# Patient Record
Sex: Male | Born: 1945
Health system: Southern US, Community
[De-identification: ages and names within clinical notes are randomized; demographics above are authoritative.]

## PROBLEM LIST (undated history)

## (undated) DIAGNOSIS — G20A1 Parkinson's disease without dyskinesia, without mention of fluctuations: Secondary | ICD-10-CM

## (undated) DIAGNOSIS — G250 Essential tremor: Secondary | ICD-10-CM

## (undated) DIAGNOSIS — I10 Essential (primary) hypertension: Secondary | ICD-10-CM

## (undated) DIAGNOSIS — G20 Parkinson's disease: Secondary | ICD-10-CM

## (undated) DIAGNOSIS — G252 Other specified forms of tremor: Secondary | ICD-10-CM

## (undated) DIAGNOSIS — F419 Anxiety disorder, unspecified: Secondary | ICD-10-CM

## (undated) DIAGNOSIS — R4789 Other speech disturbances: Secondary | ICD-10-CM

## (undated) DIAGNOSIS — E119 Type 2 diabetes mellitus without complications: Secondary | ICD-10-CM

## (undated) DIAGNOSIS — R269 Unspecified abnormalities of gait and mobility: Secondary | ICD-10-CM

## (undated) HISTORY — DX: Type 2 diabetes mellitus without complications: E11.9

## (undated) HISTORY — DX: Essential (primary) hypertension: I10

## (undated) HISTORY — DX: Anxiety disorder, unspecified: F41.9

## (undated) HISTORY — PX: OTHER SURGICAL HISTORY: SHX169

## (undated) HISTORY — DX: Unspecified abnormalities of gait and mobility: R26.9

## (undated) HISTORY — PX: NECK SURGERY: SHX720

## (undated) HISTORY — DX: Other speech disturbances: R47.89

## (undated) HISTORY — DX: Essential tremor: G25.0

## (undated) HISTORY — PX: ROTATOR CUFF REPAIR: SHX139

## (undated) HISTORY — DX: Parkinson's disease: G20

## (undated) HISTORY — PX: HERNIA REPAIR: SHX51

## (undated) HISTORY — DX: Essential tremor: G25.2

## (undated) HISTORY — DX: Parkinson's disease without dyskinesia, without mention of fluctuations: G20.A1

---

## 2001-05-15 ENCOUNTER — Ambulatory Visit (HOSPITAL_COMMUNITY): Admission: RE | Admit: 2001-05-15 | Discharge: 2001-05-15 | Payer: Self-pay | Admitting: Gastroenterology

## 2001-05-15 ENCOUNTER — Encounter (INDEPENDENT_AMBULATORY_CARE_PROVIDER_SITE_OTHER): Payer: Self-pay | Admitting: *Deleted

## 2001-05-21 ENCOUNTER — Encounter: Admission: RE | Admit: 2001-05-21 | Discharge: 2001-05-21 | Payer: Self-pay | Admitting: Gastroenterology

## 2001-05-21 ENCOUNTER — Encounter: Payer: Self-pay | Admitting: Gastroenterology

## 2001-07-10 ENCOUNTER — Encounter (INDEPENDENT_AMBULATORY_CARE_PROVIDER_SITE_OTHER): Payer: Self-pay | Admitting: *Deleted

## 2001-07-10 ENCOUNTER — Ambulatory Visit (HOSPITAL_COMMUNITY): Admission: RE | Admit: 2001-07-10 | Discharge: 2001-07-10 | Payer: Self-pay | Admitting: Gastroenterology

## 2002-02-11 ENCOUNTER — Encounter: Payer: Self-pay | Admitting: Internal Medicine

## 2002-02-11 ENCOUNTER — Encounter: Admission: RE | Admit: 2002-02-11 | Discharge: 2002-02-11 | Payer: Self-pay | Admitting: Internal Medicine

## 2002-04-21 ENCOUNTER — Encounter: Payer: Self-pay | Admitting: Neurosurgery

## 2002-04-23 ENCOUNTER — Encounter: Payer: Self-pay | Admitting: Neurosurgery

## 2002-04-23 ENCOUNTER — Observation Stay (HOSPITAL_COMMUNITY): Admission: RE | Admit: 2002-04-23 | Discharge: 2002-04-24 | Payer: Self-pay | Admitting: Neurosurgery

## 2002-04-24 ENCOUNTER — Encounter: Payer: Self-pay | Admitting: Neurosurgery

## 2002-05-15 ENCOUNTER — Encounter: Payer: Self-pay | Admitting: Orthopedic Surgery

## 2002-05-15 ENCOUNTER — Ambulatory Visit (HOSPITAL_COMMUNITY): Admission: RE | Admit: 2002-05-15 | Discharge: 2002-05-15 | Payer: Self-pay | Admitting: Orthopedic Surgery

## 2002-06-01 ENCOUNTER — Ambulatory Visit (HOSPITAL_BASED_OUTPATIENT_CLINIC_OR_DEPARTMENT_OTHER): Admission: RE | Admit: 2002-06-01 | Discharge: 2002-06-01 | Payer: Self-pay | Admitting: Internal Medicine

## 2002-06-18 ENCOUNTER — Ambulatory Visit (HOSPITAL_BASED_OUTPATIENT_CLINIC_OR_DEPARTMENT_OTHER): Admission: RE | Admit: 2002-06-18 | Discharge: 2002-06-18 | Payer: Self-pay | Admitting: Orthopedic Surgery

## 2002-08-15 ENCOUNTER — Encounter: Payer: Self-pay | Admitting: Orthopedic Surgery

## 2002-08-15 ENCOUNTER — Ambulatory Visit (HOSPITAL_COMMUNITY): Admission: RE | Admit: 2002-08-15 | Discharge: 2002-08-15 | Payer: Self-pay | Admitting: Orthopedic Surgery

## 2002-10-30 ENCOUNTER — Encounter: Payer: Self-pay | Admitting: Gastroenterology

## 2002-10-30 ENCOUNTER — Encounter: Admission: RE | Admit: 2002-10-30 | Discharge: 2002-10-30 | Payer: Self-pay | Admitting: Gastroenterology

## 2003-01-30 ENCOUNTER — Inpatient Hospital Stay (HOSPITAL_COMMUNITY): Admission: RE | Admit: 2003-01-30 | Discharge: 2003-02-02 | Payer: Self-pay | Admitting: Neurosurgery

## 2003-01-30 ENCOUNTER — Encounter: Payer: Self-pay | Admitting: Neurosurgery

## 2005-04-03 ENCOUNTER — Encounter: Admission: RE | Admit: 2005-04-03 | Discharge: 2005-04-03 | Payer: Self-pay | Admitting: Neurosurgery

## 2005-04-24 ENCOUNTER — Encounter: Admission: RE | Admit: 2005-04-24 | Discharge: 2005-04-24 | Payer: Self-pay | Admitting: Neurosurgery

## 2005-10-07 ENCOUNTER — Ambulatory Visit (HOSPITAL_COMMUNITY): Admission: RE | Admit: 2005-10-07 | Discharge: 2005-10-07 | Payer: Self-pay | Admitting: Neurosurgery

## 2006-06-20 ENCOUNTER — Observation Stay (HOSPITAL_COMMUNITY): Admission: RE | Admit: 2006-06-20 | Discharge: 2006-06-21 | Payer: Self-pay | Admitting: Neurosurgery

## 2006-11-13 ENCOUNTER — Ambulatory Visit: Payer: Self-pay | Admitting: Internal Medicine

## 2007-02-26 ENCOUNTER — Inpatient Hospital Stay (HOSPITAL_COMMUNITY): Admission: RE | Admit: 2007-02-26 | Discharge: 2007-03-01 | Payer: Self-pay | Admitting: Neurosurgery

## 2007-09-02 ENCOUNTER — Ambulatory Visit (HOSPITAL_COMMUNITY): Admission: RE | Admit: 2007-09-02 | Discharge: 2007-09-02 | Payer: Self-pay | Admitting: Gastroenterology

## 2008-08-16 ENCOUNTER — Encounter: Admission: RE | Admit: 2008-08-16 | Discharge: 2008-08-16 | Payer: Self-pay | Admitting: Neurosurgery

## 2008-08-24 ENCOUNTER — Ambulatory Visit (HOSPITAL_BASED_OUTPATIENT_CLINIC_OR_DEPARTMENT_OTHER): Admission: RE | Admit: 2008-08-24 | Discharge: 2008-08-24 | Payer: Self-pay | Admitting: Orthopedic Surgery

## 2010-10-16 ENCOUNTER — Encounter: Payer: Self-pay | Admitting: Neurosurgery

## 2010-10-16 ENCOUNTER — Encounter: Payer: Self-pay | Admitting: Neurology

## 2011-02-07 NOTE — H&P (Signed)
NAME:  Steven Pitts, Steven Pitts NO.:  [id number]   MEDICAL RECORD NO.:  [id number]          PATIENT TYPE:  INP   LOCATION:  3172                         FACILITY:  [facility]   PHYSICIAN:  Elizabeth Sauer, M.D.      DATE OF BIRTH:  12/24/1945   DATE OF ADMISSION:  02/26/2007  DATE OF DISCHARGE:                              HISTORY & PHYSICAL   ADMITTING DIAGNOSIS:  Spondylosis C3-C4.   ________   This is a very nice 65 year old, right-handed, white gentleman who  underwent an anterior decompression and fusion at C4-C5.  Did well with  that.  Developed spondylitic change at C3-C4 and C5-C6, and underwent a  second fusion at C5-C6.  Did well with that.  C3-C4 could not be  accessed anteriorly because of anatomic considerations.  He is now  admitted for posterior cervical fusion at C3-C4.   PAST MEDICAL HISTORY:  Fairly benign.  He has a history of hypertension  and GI difficulties.   PAST SURGICAL HISTORY:  Herniorrhaphy, tonsillectomy in the past.   ALLERGIES:  HE HAS NO ALLERGIES.   FAMILY HISTORY:  Parents are both deceased.  History is not given.   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   REVIEW OF SYSTEMS:  Remarkable for eye glasses, hypertension, arm  weakness, arm pain, neck pain, difficulty with speech, and depression.   MEDICATIONS:  He takes Inderal and Prozac, Nexium and  hydrochlorothiazide.   PHYSICAL EXAMINATION:  HEENT:  Within normal limits.  NECK:  He has slightly limited range of motion in his neck.  CHEST:  Clear.  CARDIAC:  Regular rate and rhythm.  ABDOMEN:  Nontender.  No hepatosplenomegaly.  EXTREMITIES:  Without clubbing or cyanosis.  GU:  Deferred.  Peripheral pulses are good.  NEUROLOGICALLY:  He is awake, alert and oriented.  His cranial nerves  are intact.  Motor exam shows 5/5 strength throughout the upper and  lower extremities.  There is a lot of pain with neck movement.  Hoffman's is negative.   Reflexes are normal.   RADIOLOGICAL STUDIES:  MRI and plain films demonstrate cervical  spondylosis with degenerative changes at C3-C4.   PLAN:  Posterior cervical fusion at C3-C4.  The risks and benefits have  been discussed with him, and he wishes to proceed.           ______________________________  Elizabeth Sauer, M.D.     MWR/MEDQ  D:  02/26/2007  T:  02/26/2007  Job:  [id number]

## 2011-02-07 NOTE — Op Note (Signed)
NAME:  Steven Pitts, Steven Pitts NO.:  [id number]   MEDICAL RECORD NO.:  [id number]          PATIENT TYPE:  INP   LOCATION:  2899                         FACILITY:  [facility]   PHYSICIAN:  Elizabeth Sauer, M.D.      DATE OF BIRTH:  [birth year]   DATE OF PROCEDURE:  02/26/2007  DATE OF DISCHARGE:                               OPERATIVE REPORT   PREOPERATIVE DIAGNOSIS:  Spondylosis C3-4.   POSTOPERATIVE DIAGNOSIS:  Spondylosis C3-4.   PROCEDURE:  C3-4 posterior cervical fusion.   SURGEON:  Elizabeth Sauer, M.D.   ANESTHESIA:  General endotracheal.   PREP:  Sterile Betadine prep and scrub with alcohol wipe.   COMPLICATIONS:  None.   NURSE ASSISTANT:  [place].   DOCTOR ASSISTANT:  Luiz Ochoa.   BODY OF TEXT:  This is 65 year old gentleman with cervical spondylosis,  could not be approached anteriorly, taken to operating room, smoothly  anesthetized intubated, placed prone on the Stryker bed.  Following  shave, prep, drape in usual sterile fashion.  Skin was infiltrated with  1% lidocaine with 1:400,000 epinephrine.  The skin was incised from C2  to C5 and the lamina of C3 and C4 expose subperiosteal plane.  Intraoperative x-ray confirmed correctness of level, having confirmed  correctness level, lateral mass screws were placed using the standard  landmarks.  They were connected with the rod and locking caps tightened.  Intraoperative x-ray showed good placement of screws.  The lamina and  facet joints decorticated the high-speed drill packed with BMP on the  Actifuse extender matrix.  Successive layers of 0-0 Vicryl, 2-0 Vicryl,  3-0 nylon were used to close.  Betadine Telfa dressing was applied.  The  patient returned recovery room in good condition.           ______________________________  Elizabeth Sauer, M.D.     MWR/MEDQ  D:  02/26/2007  T:  02/26/2007  Job:  [id number]

## 2011-02-07 NOTE — Op Note (Signed)
NAME:  Steven Pitts, Steven Pitts NO.:  [id number]   MEDICAL RECORD NO.:  [id number]          PATIENT TYPE:  AMB   LOCATION:  [place]                          FACILITY:  [facility]   PHYSICIAN:  Estill Bamberg. Ronnie Derby, M.D. DATE OF BIRTH:  July 15, 1946   DATE OF PROCEDURE:  08/24/2008  DATE OF DISCHARGE:                               OPERATIVE REPORT   SURGEON:  Estill Bamberg. Ronnie Derby, MD   ASSISTANT:  Carlynn Spry, PA-C   ANESTHESIA:  General.   PREOPERATIVE DIAGNOSES:  Left shoulder partial rotator cuff tear,  tendinosis, and a possible labral tear.   POSTOPERATIVE DIAGNOSES:  Labral tear, partial-thickness rotator cuff  tear, and osteoarthritis of the acromioclavicular joint.   INDICATIONS FOR PROCEDURE:  The patient is a 65 year old with mechanical  symptoms.  MRI evidence of tendinosis, AC joint arthritis.  Informed  consent obtained.   DESCRIPTION OF PROCEDURE:  The patient was laid supine, administered  general anesthesia, and then placed in the beach chair position.  The  left shoulder was prepped and draped in usual sterile fashion.  Anteroposterior direct lateral portals were created with a #11 blade,  blunt trocar, and cannula.  Diagnostic arthroscopy revealed no  chondromalacia in the glenohumeral joint but some degenerative labral  tearing, some was probably acute anteriorly, and this was debrided  through the anterior portal.  I then went to the subacromial space from  the posterior portal and performed a bursectomy with the Cuda shaver  through the lateral portal.  I then used the ArthroCare debridement wand  to release the CA ligament, clean off the undersurface of the acromion  and distal clavicle.  I used a 4.0-mm cylindrical bur to perform  decompression all the way to through and removing the distal clavicle.  This afforded excellent decompression.  I then used the shaver and  shaved the rotator cuff and did not find any full-thickness tearing.  I  think there was  partial-thickness tearing at the rotator cuff interval  that I did see on the undersurface, but it was obviously not full  thickness.  I then lavaged and closed with 4-0 nylon sutures, dressed  with Xeroform dressing, sponges, and __________ shoulder dressing.   COMPLICATIONS:  None.   DRAINS:  None.           ______________________________  Estill Bamberg. Ronnie Derby, M.D.     SDL/MEDQ  D:  08/24/2008  T:  08/24/2008  Job:  [id number]

## 2011-02-07 NOTE — Discharge Summary (Signed)
NAME:  Steven Pitts, Steven Pitts NO.:  [id number]   MEDICAL RECORD NO.:  [id number]          PATIENT TYPE:  INP   LOCATION:  5152                         FACILITY:  [facility]   PHYSICIAN:  Elizabeth Sauer, M.D.      DATE OF BIRTH:  [birth year]   DATE OF ADMISSION:  02/26/2007  DATE OF DISCHARGE:  03/01/2007                               DISCHARGE SUMMARY   ADMITTING DIAGNOSIS:  Spondylosis, C3-4.   POSTOPERATIVE DIAGNOSIS:  Spondylosis, C3-4.   PROCEDURES:  C3-4 posterior cervical fusion.   DICTATING DOCTOR:  Dr. Glenna Fellows.   This is a 65 year old gentleman.  His history and physical is recounted  on the chart.  He has had the interior fusion.  Could not approach C3-4  anteriorly, so he was deferred to posterior fusion.  He is admitted for  a posterior cervical fusion.  He was admitted after ascertainment of  laboratory values and on the day of admission underwent a posterior  cervical fusion at C3-4.  Procedure was uncomplicated and  postoperatively he has done well.  Required PCA for the first 2 days,  and the PCA was stopped.  Now, he is up and about eating and voiding  normally.  Using Percocet for pain.  He is neurologically intact.   PLAN:  He is being discharged home in care of his family.  His followup  will be in the [facility] offices in about a week for sutures.           ______________________________  Elizabeth Sauer, M.D.     MWR/MEDQ  D:  03/01/2007  T:  03/01/2007  Job:  [id number]

## 2011-02-10 NOTE — Procedures (Signed)
[place]  Patient:    Steven Pitts, Steven Pitts Visit Number: [id number] MRN: [id number]          Service Type: END Location: ENDO Attending Physician:  Juanita Craver Dictated by:   Nelwyn Salisbury, M.D. Proc. Date: 07/10/01 Admit Date:  07/10/2001   CC:         Orpah Melter, M.D.                           Procedure Report  DATE OF BIRTH:  01-05-1946.  PROCEDURE:  Colonoscopy.  ENDOSCOPIST:  Nelwyn Salisbury, M.D.  INSTRUMENT USED:  Olympus video colonoscope.  INDICATION FOR PROCEDURE:  Guaiac-positive stool in a 65 year old white male. Rule out colonic polyps, masses, hemorrhoids, etc.  PREPROCEDURE PREPARATION:  The patient was fasted for eight hours prior to the procedure and prepped with a bottle of magnesium citrate and a gallon of NuLytely the night prior to the procedure.  PREPROCEDURE PHYSICAL:  VITAL SIGNS:  The patient had stable vital signs.  NECK:  Supple.  CHEST:  Clear to auscultation.  S1, S2 regular.  ABDOMEN:  Soft with normal bowel sounds.  DESCRIPTION OF PROCEDURE:  The patient was placed in the left lateral decubitus position and sedated with an additional 2 mg of Versed.  Once the patient was adequately sedate and maintained on low-flow oxygen and continuous cardiac monitoring, the Olympus video colonoscope was advanced from the rectum to the cecum with slight difficulty secondary to some residual stool in the colon.  No masses, polyps, erosions, ulcerations, or diverticula were seen.  IMPRESSION:  Normal-appearing colon.  RECOMMENDATIONS:  Repeat guaiac testing will be done as an outpatient and further recommendations made as needed. Dictated by:   Nelwyn Salisbury, M.D. Attending Physician:  Juanita Craver DD:  07/10/01 TD:  07/11/01 Job: [id number]

## 2011-06-27 LAB — GLUCOSE, CAPILLARY
Glucose-Capillary: 116 — ABNORMAL HIGH
Glucose-Capillary: 117 — ABNORMAL HIGH

## 2011-06-27 LAB — BASIC METABOLIC PANEL
GFR calc Af Amer: >60
GFR calc non Af Amer: >60
Glucose, Bld: 120 — ABNORMAL HIGH
Potassium: 3.6
Sodium: 143

## 2011-06-27 LAB — POCT HEMOGLOBIN-HEMACUE: Hemoglobin: 13.8

## 2012-07-26 ENCOUNTER — Ambulatory Visit: Payer: BC Managed Care – PPO | Attending: Neurology | Admitting: Physical Therapy

## 2012-07-26 DIAGNOSIS — Z5189 Encounter for other specified aftercare: Secondary | ICD-10-CM | POA: Insufficient documentation

## 2012-07-26 DIAGNOSIS — M6281 Muscle weakness (generalized): Secondary | ICD-10-CM | POA: Insufficient documentation

## 2012-07-26 DIAGNOSIS — G20 Parkinson's disease: Secondary | ICD-10-CM | POA: Insufficient documentation

## 2012-07-26 DIAGNOSIS — R279 Unspecified lack of coordination: Secondary | ICD-10-CM | POA: Insufficient documentation

## 2012-07-26 DIAGNOSIS — G20A1 Parkinson's disease without dyskinesia, without mention of fluctuations: Secondary | ICD-10-CM | POA: Insufficient documentation

## 2012-07-26 DIAGNOSIS — M2420 Disorder of ligament, unspecified site: Secondary | ICD-10-CM | POA: Insufficient documentation

## 2012-07-26 DIAGNOSIS — M629 Disorder of muscle, unspecified: Secondary | ICD-10-CM | POA: Insufficient documentation

## 2012-08-06 ENCOUNTER — Ambulatory Visit: Payer: BC Managed Care – PPO | Admitting: Occupational Therapy

## 2012-08-07 ENCOUNTER — Ambulatory Visit: Payer: BC Managed Care – PPO | Admitting: Physical Therapy

## 2012-08-07 ENCOUNTER — Ambulatory Visit: Payer: BC Managed Care – PPO | Admitting: Occupational Therapy

## 2012-08-14 ENCOUNTER — Ambulatory Visit: Payer: BC Managed Care – PPO | Admitting: Occupational Therapy

## 2012-08-14 ENCOUNTER — Ambulatory Visit: Payer: BC Managed Care – PPO | Admitting: Physical Therapy

## 2012-08-19 ENCOUNTER — Ambulatory Visit: Payer: BC Managed Care – PPO | Admitting: Physical Therapy

## 2012-08-19 ENCOUNTER — Ambulatory Visit: Payer: BC Managed Care – PPO | Admitting: Occupational Therapy

## 2012-08-27 ENCOUNTER — Ambulatory Visit: Payer: BC Managed Care – PPO | Admitting: Physical Therapy

## 2012-08-27 ENCOUNTER — Encounter: Payer: BC Managed Care – PPO | Admitting: Occupational Therapy

## 2012-08-29 ENCOUNTER — Ambulatory Visit: Payer: BC Managed Care – PPO | Admitting: Occupational Therapy

## 2012-08-29 ENCOUNTER — Ambulatory Visit: Payer: BC Managed Care – PPO | Attending: Neurology | Admitting: Physical Therapy

## 2012-08-29 DIAGNOSIS — M6281 Muscle weakness (generalized): Secondary | ICD-10-CM | POA: Insufficient documentation

## 2012-08-29 DIAGNOSIS — G20 Parkinson's disease: Secondary | ICD-10-CM | POA: Insufficient documentation

## 2012-08-29 DIAGNOSIS — M2420 Disorder of ligament, unspecified site: Secondary | ICD-10-CM | POA: Insufficient documentation

## 2012-08-29 DIAGNOSIS — G20A1 Parkinson's disease without dyskinesia, without mention of fluctuations: Secondary | ICD-10-CM | POA: Insufficient documentation

## 2012-08-29 DIAGNOSIS — M629 Disorder of muscle, unspecified: Secondary | ICD-10-CM | POA: Insufficient documentation

## 2012-08-29 DIAGNOSIS — R279 Unspecified lack of coordination: Secondary | ICD-10-CM | POA: Insufficient documentation

## 2012-08-29 DIAGNOSIS — Z5189 Encounter for other specified aftercare: Secondary | ICD-10-CM | POA: Insufficient documentation

## 2012-09-03 ENCOUNTER — Ambulatory Visit: Payer: BC Managed Care – PPO | Admitting: Physical Therapy

## 2012-09-03 ENCOUNTER — Ambulatory Visit: Payer: BC Managed Care – PPO | Admitting: Occupational Therapy

## 2012-09-05 ENCOUNTER — Encounter: Payer: BC Managed Care – PPO | Admitting: Occupational Therapy

## 2012-09-05 ENCOUNTER — Ambulatory Visit: Payer: BC Managed Care – PPO | Admitting: Physical Therapy

## 2012-09-09 ENCOUNTER — Ambulatory Visit: Payer: BC Managed Care – PPO | Admitting: Physical Therapy

## 2012-09-11 ENCOUNTER — Ambulatory Visit: Payer: BC Managed Care – PPO | Admitting: Physical Therapy

## 2012-09-30 ENCOUNTER — Encounter: Payer: BC Managed Care – PPO | Admitting: Occupational Therapy

## 2012-09-30 ENCOUNTER — Ambulatory Visit: Payer: BC Managed Care – PPO | Admitting: Physical Therapy

## 2012-09-30 ENCOUNTER — Ambulatory Visit: Payer: BC Managed Care – PPO | Attending: Neurology | Admitting: Occupational Therapy

## 2012-09-30 DIAGNOSIS — G20A1 Parkinson's disease without dyskinesia, without mention of fluctuations: Secondary | ICD-10-CM | POA: Insufficient documentation

## 2012-09-30 DIAGNOSIS — G20 Parkinson's disease: Secondary | ICD-10-CM | POA: Insufficient documentation

## 2012-09-30 DIAGNOSIS — M629 Disorder of muscle, unspecified: Secondary | ICD-10-CM | POA: Insufficient documentation

## 2012-09-30 DIAGNOSIS — M6281 Muscle weakness (generalized): Secondary | ICD-10-CM | POA: Insufficient documentation

## 2012-09-30 DIAGNOSIS — M2420 Disorder of ligament, unspecified site: Secondary | ICD-10-CM | POA: Insufficient documentation

## 2012-09-30 DIAGNOSIS — R279 Unspecified lack of coordination: Secondary | ICD-10-CM | POA: Insufficient documentation

## 2012-09-30 DIAGNOSIS — Z5189 Encounter for other specified aftercare: Secondary | ICD-10-CM | POA: Insufficient documentation

## 2012-10-02 ENCOUNTER — Encounter: Payer: BC Managed Care – PPO | Admitting: Occupational Therapy

## 2012-10-02 ENCOUNTER — Ambulatory Visit: Payer: BC Managed Care – PPO | Admitting: Physical Therapy

## 2012-10-02 ENCOUNTER — Ambulatory Visit: Payer: BC Managed Care – PPO | Admitting: Occupational Therapy

## 2012-10-07 ENCOUNTER — Ambulatory Visit: Payer: BC Managed Care – PPO | Admitting: Physical Therapy

## 2012-10-07 ENCOUNTER — Encounter: Payer: BC Managed Care – PPO | Admitting: Occupational Therapy

## 2012-10-07 ENCOUNTER — Ambulatory Visit: Payer: BC Managed Care – PPO | Admitting: Occupational Therapy

## 2012-10-09 ENCOUNTER — Ambulatory Visit: Payer: BC Managed Care – PPO | Admitting: Physical Therapy

## 2012-10-09 ENCOUNTER — Ambulatory Visit: Payer: BC Managed Care – PPO | Admitting: Occupational Therapy

## 2012-10-09 ENCOUNTER — Encounter: Payer: BC Managed Care – PPO | Admitting: Occupational Therapy

## 2012-10-14 ENCOUNTER — Ambulatory Visit: Payer: BC Managed Care – PPO | Admitting: Physical Therapy

## 2012-10-14 ENCOUNTER — Ambulatory Visit: Payer: BC Managed Care – PPO | Admitting: Occupational Therapy

## 2012-10-16 ENCOUNTER — Ambulatory Visit: Payer: BC Managed Care – PPO | Admitting: Occupational Therapy

## 2012-10-16 ENCOUNTER — Ambulatory Visit: Payer: BC Managed Care – PPO | Admitting: Physical Therapy

## 2012-10-22 ENCOUNTER — Ambulatory Visit: Payer: BC Managed Care – PPO | Admitting: Physical Therapy

## 2012-10-22 ENCOUNTER — Encounter: Payer: BC Managed Care – PPO | Admitting: Occupational Therapy

## 2012-10-24 ENCOUNTER — Ambulatory Visit: Payer: BC Managed Care – PPO | Admitting: Physical Therapy

## 2012-10-24 ENCOUNTER — Ambulatory Visit: Payer: BC Managed Care – PPO | Admitting: Occupational Therapy

## 2012-12-17 ENCOUNTER — Other Ambulatory Visit: Payer: Self-pay | Admitting: Orthopedic Surgery

## 2012-12-17 DIAGNOSIS — M25512 Pain in left shoulder: Secondary | ICD-10-CM

## 2012-12-24 ENCOUNTER — Ambulatory Visit
Admission: RE | Admit: 2012-12-24 | Discharge: 2012-12-24 | Disposition: A | Discharge status: Home / Self Care | Payer: BC Managed Care – PPO | Source: Ambulatory Visit | Attending: Orthopedic Surgery | Admitting: Orthopedic Surgery

## 2012-12-24 DIAGNOSIS — M25512 Pain in left shoulder: Secondary | ICD-10-CM

## 2013-04-04 ENCOUNTER — Encounter: Payer: Self-pay | Admitting: Neurology

## 2013-04-04 ENCOUNTER — Ambulatory Visit (INDEPENDENT_AMBULATORY_CARE_PROVIDER_SITE_OTHER): Payer: BC Managed Care – PPO | Admitting: Neurology

## 2013-04-04 VITALS — BP 131/78 | HR 72 | Ht 68.0 in | Wt 204.0 lb

## 2013-04-04 DIAGNOSIS — G20 Parkinson's disease: Secondary | ICD-10-CM

## 2013-04-04 DIAGNOSIS — G250 Essential tremor: Secondary | ICD-10-CM

## 2013-04-04 DIAGNOSIS — G252 Other specified forms of tremor: Secondary | ICD-10-CM

## 2013-04-04 DIAGNOSIS — I10 Essential (primary) hypertension: Secondary | ICD-10-CM

## 2013-04-04 DIAGNOSIS — F411 Generalized anxiety disorder: Secondary | ICD-10-CM

## 2013-04-04 DIAGNOSIS — F419 Anxiety disorder, unspecified: Secondary | ICD-10-CM

## 2013-04-04 DIAGNOSIS — R4789 Other speech disturbances: Secondary | ICD-10-CM | POA: Insufficient documentation

## 2013-04-04 DIAGNOSIS — R269 Unspecified abnormalities of gait and mobility: Secondary | ICD-10-CM | POA: Insufficient documentation

## 2013-04-04 DIAGNOSIS — E119 Type 2 diabetes mellitus without complications: Secondary | ICD-10-CM | POA: Insufficient documentation

## 2013-04-04 DIAGNOSIS — R251 Tremor, unspecified: Secondary | ICD-10-CM | POA: Insufficient documentation

## 2013-04-04 MED ORDER — CARBIDOPA-LEVODOPA 25-100 MG PO TABS: 25.0000 | ORAL_TABLET | Freq: Every day | ORAL | Status: DC

## 2013-04-04 MED ORDER — ROPINIROLE HCL ER 12 MG PO TB24: 12.0000 mg | ORAL_TABLET | Freq: Every day | ORAL | Status: DC

## 2013-04-04 NOTE — Progress Notes (Signed)
History of Present Illness:   Mr. Steven Pitts is a 67 year old right-handed Caucasian male, alone at today's clinical visit.  He has past medical history of essential hypertension, type 2 diabetes,obstructive sleep apnea, using CPAP machine, depression anxiety, he presented with  5 years history of gradual onset, slow worsening gait difficulty.  At age 41, he began to experience bilateral hand tremor, voice tremor, especially when he is anxious,he was diagnosed with essential tremor, has been treated with propranolol since 1982, which has been very effective in controlling his hands and voice tremor.   However over the last  5 years, he began to experience, gait difficulty, he noticed decreases swing of his right arm,unsteadiness, and slowness.  he was evaluated by [facility] neurologic Dr. Octaviano Batty,  was started on Requip, titrating dose about 2 years ago.  he was also he referred to [facility] Dr. Linus Mako for secondary opinion, was started on Sinemet, the combination medication overall has been very helpful.  I have switched him to requip Xr 29m q9pm, he tolerated it very well. He is also taking sinemet 25/100 at 7,10,14, 17.  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  [facility] was [facility].  He has used CPAP machine for his sleep apnea for 20 years. His brother at age 65  died of aspiration, had Parkinson's disease for 3 years, rapid progression, required deep brain stimulator.  His parents died of early 642s there was no Parkinsonism. He has two adult healthy children.  UPDATE July 11th 2014:   He is now retired, exercise regularly, stationary bike one hour each day, he noticed left neck tilt, neck pain, continue with gait difficulty, using cane occasionally, no dysphagia, mild slurred speech, he is taking sinemet 25/100 5 times a day, requip xr 178mqhs, doing very  well, no significant side effect noticed.    Review of Systems  Out of a complete 14 system review, the patient complains of only the following symptoms, and all other reviewed systems are negative.  Neurological: Confusion   Weakness   Difficulty Swallowing   Tremor   Sleep: Restless legs   ENT: Trouble swallowing   Gastrointestinal: Constipation      Physical Exam  Neck: supple no carotid bruits Respiratory: clear to auscultation bilaterally Cardiovascular: regular rate rhythm  Neurologic Exam  Mental Status: scanned speech, has neck tilt to left. Cranial Nerves: CN II-XII pupils were equal round reactive to light.  Extraocular movements were full.  Visual fields were full on confrontational test.  Facial sensation and strength were normal.  Hearing was intact to finger rubbing bilaterally.  Uvula tongue were midline.  Head turning and shoulder shrugging were normal and symmetric.  Tongue protrusion into the cheeks strength were normal.  Motor: no significant rigidity or bradykinesia, no weakness. Sensory: Normal to light touch Coordination: there was finger to nose and heel to shin dysmetria, mild to moderate trunk ataxia Gait and Station: stand up from seated postion without difficulty, no difficulty initiate gait, but stiff, head tilt to left side, wide based, fairly steady wide gait, moderate stride, smooth turning Reflexes: Deep tendon reflexes: hyperactive and symmetric, patellar 3/3  Plantar responses are flexor.   Assessment and Plan:  [age]ear old male, with dystonia, dysphonia, and trunk ataxia, mild Parkinsonism, atypical parkinsons, luckily, he responded well to Dopamine agents  1,Keep current medications, requip xr 1267mhs and increase sinemet  25/100 to 5 times a day 2. Moderate exercise. 3. RTC in 12 months

## 2013-05-06 ENCOUNTER — Other Ambulatory Visit: Payer: Self-pay | Admitting: Neurosurgery

## 2013-05-06 DIAGNOSIS — M47812 Spondylosis without myelopathy or radiculopathy, cervical region: Secondary | ICD-10-CM

## 2013-05-09 ENCOUNTER — Ambulatory Visit
Admission: RE | Admit: 2013-05-09 | Discharge: 2013-05-09 | Disposition: A | Discharge status: Home / Self Care | Payer: BC Managed Care – PPO | Source: Ambulatory Visit | Attending: Neurosurgery | Admitting: Neurosurgery

## 2013-05-09 DIAGNOSIS — M47812 Spondylosis without myelopathy or radiculopathy, cervical region: Secondary | ICD-10-CM

## 2013-05-09 MED ORDER — GADOBENATE DIMEGLUMINE 529 MG/ML IV SOLN
19.0000 mL | Freq: Once | INTRAVENOUS | Status: AC | PRN
  Administered 2013-05-09: 19 mL via INTRAVENOUS

## 2013-07-24 ENCOUNTER — Ambulatory Visit: Payer: BC Managed Care – PPO | Admitting: Physical Therapy

## 2013-07-24 DIAGNOSIS — M6281 Muscle weakness (generalized): Secondary | ICD-10-CM

## 2013-07-24 DIAGNOSIS — M25519 Pain in unspecified shoulder: Secondary | ICD-10-CM

## 2013-07-24 DIAGNOSIS — M25619 Stiffness of unspecified shoulder, not elsewhere classified: Secondary | ICD-10-CM

## 2013-07-30 DIAGNOSIS — M6281 Muscle weakness (generalized): Secondary | ICD-10-CM

## 2013-07-30 DIAGNOSIS — M25619 Stiffness of unspecified shoulder, not elsewhere classified: Secondary | ICD-10-CM

## 2013-07-30 DIAGNOSIS — Z9889 Other specified postprocedural states: Secondary | ICD-10-CM

## 2013-07-30 DIAGNOSIS — M25519 Pain in unspecified shoulder: Secondary | ICD-10-CM

## 2013-08-01 ENCOUNTER — Encounter: Payer: BC Managed Care – PPO | Admitting: Physical Therapy

## 2013-08-01 DIAGNOSIS — M25619 Stiffness of unspecified shoulder, not elsewhere classified: Secondary | ICD-10-CM

## 2013-08-01 DIAGNOSIS — Z9889 Other specified postprocedural states: Secondary | ICD-10-CM

## 2013-08-01 DIAGNOSIS — M6281 Muscle weakness (generalized): Secondary | ICD-10-CM

## 2013-08-01 DIAGNOSIS — M25519 Pain in unspecified shoulder: Secondary | ICD-10-CM

## 2013-08-05 ENCOUNTER — Encounter: Payer: BC Managed Care – PPO | Admitting: Physical Therapy

## 2013-08-05 DIAGNOSIS — M6281 Muscle weakness (generalized): Secondary | ICD-10-CM

## 2013-08-05 DIAGNOSIS — M25619 Stiffness of unspecified shoulder, not elsewhere classified: Secondary | ICD-10-CM

## 2013-08-05 DIAGNOSIS — M25519 Pain in unspecified shoulder: Secondary | ICD-10-CM

## 2013-08-07 ENCOUNTER — Encounter: Payer: BC Managed Care – PPO | Admitting: Physical Therapy

## 2013-08-07 DIAGNOSIS — M25519 Pain in unspecified shoulder: Secondary | ICD-10-CM

## 2013-08-07 DIAGNOSIS — M25619 Stiffness of unspecified shoulder, not elsewhere classified: Secondary | ICD-10-CM

## 2013-08-07 DIAGNOSIS — M6281 Muscle weakness (generalized): Secondary | ICD-10-CM

## 2013-08-07 DIAGNOSIS — Z9889 Other specified postprocedural states: Secondary | ICD-10-CM

## 2013-08-12 ENCOUNTER — Encounter: Payer: Medicare PPO | Admitting: Physical Therapy

## 2013-08-12 DIAGNOSIS — M6281 Muscle weakness (generalized): Secondary | ICD-10-CM

## 2013-08-12 DIAGNOSIS — M25519 Pain in unspecified shoulder: Secondary | ICD-10-CM

## 2013-08-12 DIAGNOSIS — Z9889 Other specified postprocedural states: Secondary | ICD-10-CM

## 2013-08-12 DIAGNOSIS — M25619 Stiffness of unspecified shoulder, not elsewhere classified: Secondary | ICD-10-CM

## 2013-08-14 DIAGNOSIS — M25619 Stiffness of unspecified shoulder, not elsewhere classified: Secondary | ICD-10-CM

## 2013-08-14 DIAGNOSIS — M6281 Muscle weakness (generalized): Secondary | ICD-10-CM

## 2013-08-14 DIAGNOSIS — M25519 Pain in unspecified shoulder: Secondary | ICD-10-CM

## 2013-08-14 DIAGNOSIS — Z9889 Other specified postprocedural states: Secondary | ICD-10-CM

## 2013-08-19 ENCOUNTER — Encounter: Payer: BC Managed Care – PPO | Admitting: Physical Therapy

## 2013-08-19 DIAGNOSIS — Z9889 Other specified postprocedural states: Secondary | ICD-10-CM

## 2013-08-19 DIAGNOSIS — M25619 Stiffness of unspecified shoulder, not elsewhere classified: Secondary | ICD-10-CM

## 2013-08-19 DIAGNOSIS — M25519 Pain in unspecified shoulder: Secondary | ICD-10-CM

## 2013-08-19 DIAGNOSIS — M6281 Muscle weakness (generalized): Secondary | ICD-10-CM

## 2013-12-10 ENCOUNTER — Other Ambulatory Visit: Payer: Self-pay

## 2013-12-10 MED ORDER — ROPINIROLE HCL ER 12 MG PO TB24: 12.0000 mg | ORAL_TABLET | Freq: Every day | ORAL | Status: DC

## 2013-12-10 MED ORDER — CARBIDOPA-LEVODOPA 25-100 MG PO TABS: 25.0000 | ORAL_TABLET | Freq: Every day | ORAL | Status: DC

## 2013-12-11 ENCOUNTER — Telehealth: Payer: Self-pay | Admitting: Neurology

## 2013-12-11 MED ORDER — ROPINIROLE HCL ER 12 MG PO TB24
12.0000 mg | ORAL_TABLET | Freq: Every day | ORAL | Status: DC
Start: 2013-12-11 — End: 2014-04-06

## 2013-12-11 MED ORDER — CARBIDOPA-LEVODOPA 25-100 MG PO TABS: 25.0000 | ORAL_TABLET | Freq: Every day | ORAL | Status: DC

## 2013-12-11 NOTE — Telephone Encounter (Signed)
I called back.  Verified Rx.  They will fill med and call patient when ready.

## 2013-12-11 NOTE — Telephone Encounter (Signed)
Terry from [facility] called has question concerning directions on medication carbidopa-levodopa (SINEMET IR) 25-100 MG per tablet. Terry's [phone]. Thanks

## 2013-12-11 NOTE — Telephone Encounter (Signed)
Pt states pharmacy sent fax over req for Ropinirole HCl 12 MG TB24 & carbidopa-levodopa (SINEMET IR) 25-100 MG on 12/08/13. Pt states he has not recd it and has been out for 2 days. Please call pt and advise on this, pt states he needs these pills. Thanks

## 2013-12-11 NOTE — Telephone Encounter (Signed)
We had no pharmacy on file for the patient.  I called him back.  Says he uses [facility].  Rx's have been sent.

## 2014-04-06 ENCOUNTER — Encounter: Payer: Self-pay | Admitting: Neurology

## 2014-04-06 ENCOUNTER — Ambulatory Visit (INDEPENDENT_AMBULATORY_CARE_PROVIDER_SITE_OTHER): Payer: Medicare PPO | Admitting: Neurology

## 2014-04-06 ENCOUNTER — Encounter (INDEPENDENT_AMBULATORY_CARE_PROVIDER_SITE_OTHER): Payer: Self-pay

## 2014-04-06 VITALS — BP 135/75 | HR 77 | Ht 68.0 in | Wt 200.0 lb

## 2014-04-06 DIAGNOSIS — G20A1 Parkinson's disease without dyskinesia, without mention of fluctuations: Secondary | ICD-10-CM

## 2014-04-06 DIAGNOSIS — R269 Unspecified abnormalities of gait and mobility: Secondary | ICD-10-CM

## 2014-04-06 DIAGNOSIS — G20 Parkinson's disease: Secondary | ICD-10-CM

## 2014-04-06 MED ORDER — ROPINIROLE HCL ER 12 MG PO TB24: 12.0000 mg | ORAL_TABLET | Freq: Every day | ORAL | Status: DC

## 2014-04-06 MED ORDER — CARBIDOPA-LEVODOPA 25-100 MG PO TABS: 25.0000 | ORAL_TABLET | Freq: Every day | ORAL | Status: DC

## 2014-04-06 NOTE — Progress Notes (Signed)
History of Present Illness:   Steven Pitts is a 68 year-old right-handed Caucasian male, alone at today's clinical visit.  He has past medical history of essential hypertension, type 2 diabetes,obstructive sleep apnea, using CPAP machine, depression anxiety, he presented with  5 years history of gradual onset, slow worsening gait difficulty.  At age 78, he began to experience bilateral hand tremor, voice tremor, especially when he is anxious,he was diagnosed with essential tremor, has been treated with propranolol since 1982, which has been very effective in controlling his hands and voice tremor.   However over the last  5 years, he began to experience, gait difficulty, he noticed decreases swing of his right arm,unsteadiness, and slowness.  he was evaluated by [facility] neurologic Dr. Octaviano Batty,  was started on Requip, titrating dose about 2 years ago.  he was also he referred to [facility] Dr. Linus Mako for secondary opinion, was started on Sinemet, the combination medication overall has been very helpful.  I have switched him to requip Xr 76m q9pm, he tolerated it very well. He is also taking sinemet 25/100 at 7,10,14, 17.  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  [facility].  He has used CPAP machine for his sleep apnea for 20 years. His brother at age 66  died of aspiration, had Parkinson's disease for 3 years, rapid progression, required deep brain stimulator.  His parents died of early 618s there was no Parkinsonism. He has two adult healthy children.  UPDATE April 06 2014:   He exercise regularly, he has slight worsening since last year, mild gait difficulty, dragging his right leg, he also noticed gradually worsening right arm stiffness, if he uses his left hand and arm. no dysphagia, mild slurred speech, he is taking sinemet 25/100 5 times a day,  requip xr 136mqhs, doing very well, no significant side effect noticed.   Review of Systems  Out of a complete 14 system review, the patient complains of only the following symptoms, and all other reviewed systems are negative.  Activity change, fatigue, trouble swallowing, constipation, restless legs, apnea, daytime sleepiness, urgency, joint pain, back pain, achy muscles, muscle cramps, walking difficulty, neck pain, neck stiffness, memory loss, speech difficulty, tremor, depression  Physical Exam  Neck: supple no carotid bruits Respiratory: clear to auscultation bilaterally Cardiovascular: regular rate rhythm  Neurologic Exam  Mental Status: scanned speech, has neck tilt to left. Cranial Nerves: CN II-XII pupils were equal round reactive to light.  Extraocular movements were full.  Visual fields were full on confrontational test.  Facial sensation and strength were normal.  Hearing was intact to finger rubbing bilaterally.  Uvula tongue were midline.  Head turning and shoulder shrugging were normal and symmetric.  Tongue protrusion into the cheeks strength were normal.  Motor: no significant rigidity or bradykinesia, no weakness. Sensory: Normal to light touch Coordination: there was finger to nose and heel to shin dysmetria, mild to moderate trunk ataxia Gait and Station: stand up from seated postion without difficulty, no difficulty initiate gait, but stiff, head tilt to left side, wide based, fairly steady wide gait, moderate stride, smooth turning Reflexes: Deep tendon reflexes: hyperactive and symmetric, patellar 3/3  Plantar responses are flexor.   Assessment and Plan:  [age]ear old male, with dystonia, dysphonia, and trunk ataxia, mild Parkinsonism, atypical parkinsons, luckily, he responded well to Dopamine agents  1,Keep  current medications, requip xr 87m qhs and increase sinemet 25/100 to 5 times a day 2. Moderate exercise. 3. RTC in 12 months

## 2014-08-06 ENCOUNTER — Other Ambulatory Visit: Payer: Self-pay | Admitting: Gastroenterology

## 2014-08-06 DIAGNOSIS — R1011 Right upper quadrant pain: Secondary | ICD-10-CM

## 2014-08-10 ENCOUNTER — Ambulatory Visit
Admission: RE | Admit: 2014-08-10 | Discharge: 2014-08-10 | Disposition: A | Discharge status: Home / Self Care | Payer: Medicare PPO | Source: Ambulatory Visit | Attending: Gastroenterology | Admitting: Gastroenterology

## 2014-08-10 DIAGNOSIS — R1011 Right upper quadrant pain: Secondary | ICD-10-CM

## 2014-08-19 ENCOUNTER — Encounter: Payer: Self-pay | Admitting: Neurology

## 2014-09-14 ENCOUNTER — Encounter: Payer: Self-pay | Admitting: Neurology

## 2014-12-04 DIAGNOSIS — D3131 Benign neoplasm of right choroid: Secondary | ICD-10-CM | POA: Diagnosis not present

## 2014-12-04 DIAGNOSIS — H04123 Dry eye syndrome of bilateral lacrimal glands: Secondary | ICD-10-CM | POA: Diagnosis not present

## 2014-12-04 DIAGNOSIS — H02831 Dermatochalasis of right upper eyelid: Secondary | ICD-10-CM | POA: Diagnosis not present

## 2014-12-04 DIAGNOSIS — D3132 Benign neoplasm of left choroid: Secondary | ICD-10-CM | POA: Diagnosis not present

## 2014-12-04 DIAGNOSIS — H02834 Dermatochalasis of left upper eyelid: Secondary | ICD-10-CM | POA: Diagnosis not present

## 2014-12-04 DIAGNOSIS — H2513 Age-related nuclear cataract, bilateral: Secondary | ICD-10-CM | POA: Diagnosis not present

## 2014-12-04 DIAGNOSIS — E119 Type 2 diabetes mellitus without complications: Secondary | ICD-10-CM | POA: Diagnosis not present

## 2014-12-04 DIAGNOSIS — H31092 Other chorioretinal scars, left eye: Secondary | ICD-10-CM | POA: Diagnosis not present

## 2015-04-07 ENCOUNTER — Ambulatory Visit: Payer: Medicare PPO | Admitting: Nurse Practitioner

## 2015-04-07 ENCOUNTER — Ambulatory Visit (INDEPENDENT_AMBULATORY_CARE_PROVIDER_SITE_OTHER): Payer: Medicare PPO | Admitting: Nurse Practitioner

## 2015-04-07 ENCOUNTER — Encounter: Payer: Self-pay | Admitting: Nurse Practitioner

## 2015-04-07 VITALS — BP 121/68 | HR 62 | Ht 68.0 in | Wt 190.5 lb

## 2015-04-07 DIAGNOSIS — E1151 Type 2 diabetes mellitus with diabetic peripheral angiopathy without gangrene: Secondary | ICD-10-CM | POA: Diagnosis not present

## 2015-04-07 DIAGNOSIS — G20 Parkinson's disease: Secondary | ICD-10-CM

## 2015-04-07 DIAGNOSIS — G250 Essential tremor: Secondary | ICD-10-CM

## 2015-04-07 DIAGNOSIS — F329 Major depressive disorder, single episode, unspecified: Secondary | ICD-10-CM | POA: Diagnosis not present

## 2015-04-07 DIAGNOSIS — R269 Unspecified abnormalities of gait and mobility: Secondary | ICD-10-CM

## 2015-04-07 DIAGNOSIS — G252 Other specified forms of tremor: Secondary | ICD-10-CM

## 2015-04-07 DIAGNOSIS — R251 Tremor, unspecified: Secondary | ICD-10-CM | POA: Diagnosis not present

## 2015-04-07 DIAGNOSIS — K5900 Constipation, unspecified: Secondary | ICD-10-CM | POA: Diagnosis not present

## 2015-04-07 DIAGNOSIS — I739 Peripheral vascular disease, unspecified: Secondary | ICD-10-CM | POA: Diagnosis not present

## 2015-04-07 DIAGNOSIS — E1140 Type 2 diabetes mellitus with diabetic neuropathy, unspecified: Secondary | ICD-10-CM | POA: Diagnosis not present

## 2015-04-07 DIAGNOSIS — E785 Hyperlipidemia, unspecified: Secondary | ICD-10-CM | POA: Diagnosis not present

## 2015-04-07 DIAGNOSIS — I10 Essential (primary) hypertension: Secondary | ICD-10-CM | POA: Diagnosis not present

## 2015-04-07 DIAGNOSIS — E669 Obesity, unspecified: Secondary | ICD-10-CM | POA: Diagnosis not present

## 2015-04-07 MED ORDER — CARBIDOPA-LEVODOPA 25-100 MG PO TABS: 25.0000 | ORAL_TABLET | Freq: Every day | ORAL | Status: DC

## 2015-04-07 MED ORDER — ROPINIROLE HCL ER 12 MG PO TB24: 12.0000 mg | ORAL_TABLET | Freq: Every day | ORAL | Status: DC

## 2015-04-07 NOTE — Patient Instructions (Signed)
Continue carbidopa levodopa at current dose will refill Continue Requip at current dose will refill Continue to use cane at all times for safe ambulation Continue exercise program ,stationary bike and flexibility exercises Follow-up yearly and when necessary

## 2015-04-07 NOTE — Progress Notes (Signed)
[facility]  PATIENT: Steven Pitts DOB: Aug 15, 1946   REASON FOR VISIT: Follow-up for Parkinson's disease, essential tremor and gait abnormality HISTORY FROM: Patient    HISTORY OF PRESENT ILLNESS:Steven Pitts is a 69 year-old right-handed Caucasian male, alone at today's clinical visit. He has past medical history of essential hypertension, type 2 diabetes,obstructive sleep apnea, using CPAP machine, depression anxiety, he presented with 5 years history of gradual onset, slow worsening gait difficulty. At age 78, he began to experience bilateral hand tremor, voice tremor, especially when he is anxious,he was diagnosed with essential tremor, has been treated with propranolol since 1982, which has been very effective in controlling his hands and voice tremor.  However over the last 5 years, he began to experience, gait difficulty, he noticed decreases swing of his right arm,unsteadiness, and slowness. he was evaluated by [facility] neurologic Dr. Octaviano Batty, was started on Requip, titrating dose about 2 years ago. he was also he referred to [facility] Dr. Linus Mako for secondary opinion, was started on Sinemet, the combination medication overall has been very helpful. I have switched him to requip Xr 17m q9pm, he tolerated it very well. He is also taking sinemet 25/100 at 7,10,14, 17. 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 [facility] with [facility], was normal. He has used CPAP machine for his sleep apnea for 20 years. His brother at age 67 died of aspiration, had Parkinson's disease for 3 years, rapid progression, required deep brain stimulator. His parents died of early 674s there was no Parkinsonism. He has two adult healthy children.  UPDATE April 06 2014: He exercise regularly, he has slight worsening since last year, mild gait difficulty, dragging  his right leg, he also noticed gradually worsening right arm stiffness, if he uses his left hand and arm. no dysphagia, mild slurred speech, he is taking sinemet 25/100 5 times a day, requip xr 110mqhs, doing very well, no significant side effect noticed.  UPDATE 04/07/2015 Steven Pitts [age]ear old male returns for follow-up. He has history of Parkinson's disease for several years and essential tremor since the age of [age]He ambulates with a single-point cane, denies any falls. Currently on Sinemet 5 times a day and Requip extended release at bedtime. Denies side effects to his medications. He continues to exercise regularly 6 hours a week on the stationary bike and with flexibility bands. He takes about a 30 minute nap daily. He denies any weakness, any freezing spells, any difficulty turning over in bed, mild difficulty swallowing at times is used to taking small bites. He returns for reevaluation     REVIEW OF SYSTEMS: Full 14 system review of systems performed and notable only for those listed, all others are neg:  Constitutional: neg  Cardiovascular: neg Ear/Nose/Throat: neg  Skin: neg Eyes: neg Respiratory: neg Gastroitestinal: neg  Hematology/Lymphatic: neg  Endocrine: neg Musculoskeletal: Aching muscles Allergy/Immunology: neg Neurological: Memory loss Psychiatric: neg Sleep : Restless legs   ALLERGIES: No Known Allergies  HOME MEDICATIONS: Outpatient Prescriptions Prior to Visit  Medication Sig Dispense Refill  . atorvastatin (LIPITOR) 20 MG tablet Take 20 mg by mouth daily.    . carbidopa-levodopa (SINEMET IR) 25-100 MG per tablet Take 25-100 tablets by mouth 5 (five) times daily. 150 tablet 12  . DULoxetine (CYMBALTA) 60 MG capsule Take 60 mg by mouth daily.    . Marland Kitchenlimepiride (AMARYL) 4  MG tablet Take 4 mg by mouth daily.    Marland Kitchen HYDROcodone-acetaminophen (NORCO) 10-325 MG per tablet Take 10-325 tablets by mouth daily.    Marland Kitchen ibuprofen (ADVIL,MOTRIN) 800 MG tablet Take 800  mg by mouth daily.    . INVOKANA 300 MG TABS Take 300 mg by mouth daily.    . metFORMIN (GLUCOPHAGE) 1000 MG tablet Take 1,000 mg by mouth daily.    Marland Kitchen NEXIUM 40 MG capsule Take 40 mg by mouth daily.    . polyethylene glycol powder (GLYCOLAX/MIRALAX) powder Take 3,350 g by mouth daily.    . propranolol (INDERAL) 40 MG tablet Take 40 mg by mouth daily.    . Ropinirole HCl 12 MG TB24 Take 1 tablet (12 mg total) by mouth daily. 30 tablet 12  . telmisartan-hydrochlorothiazide (MICARDIS HCT) 80-25 MG per tablet Take 80 tablets by mouth daily.    Marland Kitchen lamoTRIgine (LAMICTAL) 100 MG tablet Take 100 mg by mouth daily.     No facility-administered medications prior to visit.    PAST MEDICAL HISTORY: Past Medical History  Diagnosis Date  . High blood pressure   . Anxiety   . Diabetes   . Essential and other specified forms of tremor   . Other speech disturbance(784.59)   . Abnormality of gait   . Parkinson's disease     PAST SURGICAL HISTORY: Past Surgical History  Procedure Laterality Date  . Hernia repair    . Neck surgery      x4  . Rotator cuff repair Left     FAMILY HISTORY: Family History  Problem Relation Age of Onset  . Alcohol abuse Father   . Aneurysm Mother     SOCIAL HISTORY: History   Social History  . Marital Status: Married    Spouse Name: N/A  . Number of Children: N/A  . Years of Education: N/A   Occupational History  . Not on file.   Social History Main Topics  . Smoking status: Former Smoker    Types: Cigarettes  . Smokeless tobacco: Never Used     Comment: quit 1980  . Alcohol Use: No     Comment: quit in 1980  . Drug Use: No  . Sexual Activity: Not on file   Other Topics Concern  . Not on file   Social History Narrative   Patient is a Art gallery manager for [employer]. Patient has a college education. Patient is married to Bethena Roys) for 37 years.     PHYSICAL EXAM  Filed Vitals:   04/07/15 1342  BP: 121/68  Pulse: 62  Height: _0  (1.727 m)    Weight: 190 lb 8 oz (86.41 kg)   Body mass index is 28.97 kg/(m^2). General: Well-developed well-nourished in no distress, well groomed mild masking of the face Neck: supple no carotid bruits Musculoskeletal no deformity Neurologic Exam  Mental Status: Alert and appropriate follows all commands, has neck tilt to left. Mild dysphonia Cranial Nerves: CN II-XII pupils were equal round reactive to light. Extraocular movements were full. Visual fields were full on confrontational test. Facial sensation and strength were normal. Hearing was intact to finger rubbing bilaterally. Uvula tongue were midline. Head turning and shoulder shrugging were normal and symmetric. Tongue protrusion into the cheeks strength were normal. Myerson sign negative Motor: no significant rigidity or bradykinesia, no weakness. Sensory: Normal to light touch Coordination: there was finger to nose and heel to shin dysmetria, mild to moderate trunk ataxia Gait and Station: stand up from seated postion without  difficulty, no difficulty to initiate gait,  head tilt to left side, wide based, fairly steady  moderate stride, smooth turning ambulated 210 feet in 1 minute 5 seconds. Reflexes: Deep tendon reflexes: hyperactive and symmetric, patellar 3/3 Plantar responses are flexor.   DIAGNOSTIC DATA (LABS, IMAGING, TESTING) -  ASSESSMENT AND PLAN  69 y.o. year old male  has a past medical history of dystonia, dysphonia mild parkinsonism , has responded well to dopamine agents.  Continue carbidopa levodopa at current dose will refill Continue Requip at current dose will refill Continue to use cane at all times for safe ambulation Continue exercise program ,stationary bike and flexibility exercises Follow-up yearly and when necessary Steven Bible, [facility], [facility], APRN  [facility] [address], [place], [place] [address] [phone]

## 2015-04-08 NOTE — Progress Notes (Signed)
I have reviewed and agreed above plan. 

## 2015-04-28 DIAGNOSIS — F3341 Major depressive disorder, recurrent, in partial remission: Secondary | ICD-10-CM | POA: Diagnosis not present

## 2015-05-20 DIAGNOSIS — M160 Bilateral primary osteoarthritis of hip: Secondary | ICD-10-CM | POA: Diagnosis not present

## 2015-05-20 DIAGNOSIS — M19012 Primary osteoarthritis, left shoulder: Secondary | ICD-10-CM | POA: Diagnosis not present

## 2015-05-20 DIAGNOSIS — M25552 Pain in left hip: Secondary | ICD-10-CM | POA: Diagnosis not present

## 2015-05-20 DIAGNOSIS — M25512 Pain in left shoulder: Secondary | ICD-10-CM | POA: Diagnosis not present

## 2015-05-20 DIAGNOSIS — M19011 Primary osteoarthritis, right shoulder: Secondary | ICD-10-CM | POA: Diagnosis not present

## 2015-05-20 DIAGNOSIS — M25511 Pain in right shoulder: Secondary | ICD-10-CM | POA: Diagnosis not present

## 2015-05-20 DIAGNOSIS — M25551 Pain in right hip: Secondary | ICD-10-CM | POA: Diagnosis not present

## 2015-05-20 DIAGNOSIS — Z9889 Other specified postprocedural states: Secondary | ICD-10-CM | POA: Diagnosis not present

## 2015-05-24 DIAGNOSIS — M25519 Pain in unspecified shoulder: Secondary | ICD-10-CM | POA: Diagnosis not present

## 2015-05-24 DIAGNOSIS — M25559 Pain in unspecified hip: Secondary | ICD-10-CM | POA: Diagnosis not present

## 2015-05-27 DIAGNOSIS — M25559 Pain in unspecified hip: Secondary | ICD-10-CM | POA: Diagnosis not present

## 2015-05-27 DIAGNOSIS — M25519 Pain in unspecified shoulder: Secondary | ICD-10-CM | POA: Diagnosis not present

## 2015-06-01 DIAGNOSIS — M25519 Pain in unspecified shoulder: Secondary | ICD-10-CM | POA: Diagnosis not present

## 2015-06-01 DIAGNOSIS — M25559 Pain in unspecified hip: Secondary | ICD-10-CM | POA: Diagnosis not present

## 2015-06-03 DIAGNOSIS — M25559 Pain in unspecified hip: Secondary | ICD-10-CM | POA: Diagnosis not present

## 2015-06-03 DIAGNOSIS — M25519 Pain in unspecified shoulder: Secondary | ICD-10-CM | POA: Diagnosis not present

## 2015-06-07 DIAGNOSIS — E785 Hyperlipidemia, unspecified: Secondary | ICD-10-CM | POA: Diagnosis not present

## 2015-06-07 DIAGNOSIS — E1151 Type 2 diabetes mellitus with diabetic peripheral angiopathy without gangrene: Secondary | ICD-10-CM | POA: Diagnosis not present

## 2015-06-07 DIAGNOSIS — Z125 Encounter for screening for malignant neoplasm of prostate: Secondary | ICD-10-CM | POA: Diagnosis not present

## 2015-06-07 DIAGNOSIS — I10 Essential (primary) hypertension: Secondary | ICD-10-CM | POA: Diagnosis not present

## 2015-06-08 DIAGNOSIS — M25519 Pain in unspecified shoulder: Secondary | ICD-10-CM | POA: Diagnosis not present

## 2015-06-08 DIAGNOSIS — M25559 Pain in unspecified hip: Secondary | ICD-10-CM | POA: Diagnosis not present

## 2015-06-10 DIAGNOSIS — M25519 Pain in unspecified shoulder: Secondary | ICD-10-CM | POA: Diagnosis not present

## 2015-06-10 DIAGNOSIS — M25559 Pain in unspecified hip: Secondary | ICD-10-CM | POA: Diagnosis not present

## 2015-06-14 DIAGNOSIS — H6123 Impacted cerumen, bilateral: Secondary | ICD-10-CM | POA: Diagnosis not present

## 2015-06-14 DIAGNOSIS — E1151 Type 2 diabetes mellitus with diabetic peripheral angiopathy without gangrene: Secondary | ICD-10-CM | POA: Diagnosis not present

## 2015-06-14 DIAGNOSIS — I739 Peripheral vascular disease, unspecified: Secondary | ICD-10-CM | POA: Diagnosis not present

## 2015-06-14 DIAGNOSIS — F329 Major depressive disorder, single episode, unspecified: Secondary | ICD-10-CM | POA: Diagnosis not present

## 2015-06-14 DIAGNOSIS — I10 Essential (primary) hypertension: Secondary | ICD-10-CM | POA: Diagnosis not present

## 2015-06-14 DIAGNOSIS — G20 Parkinson's disease: Secondary | ICD-10-CM | POA: Diagnosis not present

## 2015-06-14 DIAGNOSIS — E785 Hyperlipidemia, unspecified: Secondary | ICD-10-CM | POA: Diagnosis not present

## 2015-06-14 DIAGNOSIS — K219 Gastro-esophageal reflux disease without esophagitis: Secondary | ICD-10-CM | POA: Diagnosis not present

## 2015-06-14 DIAGNOSIS — Z0000 Encounter for general adult medical examination without abnormal findings: Secondary | ICD-10-CM | POA: Diagnosis not present

## 2015-06-23 DIAGNOSIS — Z1212 Encounter for screening for malignant neoplasm of rectum: Secondary | ICD-10-CM | POA: Diagnosis not present

## 2015-07-01 DIAGNOSIS — F3341 Major depressive disorder, recurrent, in partial remission: Secondary | ICD-10-CM | POA: Diagnosis not present

## 2015-09-13 DIAGNOSIS — I739 Peripheral vascular disease, unspecified: Secondary | ICD-10-CM | POA: Diagnosis not present

## 2015-09-13 DIAGNOSIS — G4733 Obstructive sleep apnea (adult) (pediatric): Secondary | ICD-10-CM | POA: Diagnosis not present

## 2015-09-13 DIAGNOSIS — M545 Low back pain: Secondary | ICD-10-CM | POA: Diagnosis not present

## 2015-09-13 DIAGNOSIS — E1151 Type 2 diabetes mellitus with diabetic peripheral angiopathy without gangrene: Secondary | ICD-10-CM | POA: Diagnosis not present

## 2015-09-13 DIAGNOSIS — Z6829 Body mass index (BMI) 29.0-29.9, adult: Secondary | ICD-10-CM | POA: Diagnosis not present

## 2015-10-28 ENCOUNTER — Other Ambulatory Visit: Payer: Self-pay | Admitting: Orthopedic Surgery

## 2015-10-28 DIAGNOSIS — M545 Low back pain: Secondary | ICD-10-CM

## 2015-11-02 ENCOUNTER — Ambulatory Visit
Admission: RE | Admit: 2015-11-02 | Discharge: 2015-11-02 | Disposition: A | Discharge status: Home / Self Care | Payer: Commercial Managed Care - HMO | Source: Ambulatory Visit | Attending: Orthopedic Surgery | Admitting: Orthopedic Surgery

## 2015-11-02 DIAGNOSIS — M545 Low back pain: Secondary | ICD-10-CM

## 2016-04-06 ENCOUNTER — Encounter: Payer: Self-pay | Admitting: Nurse Practitioner

## 2016-04-06 ENCOUNTER — Ambulatory Visit (INDEPENDENT_AMBULATORY_CARE_PROVIDER_SITE_OTHER): Payer: Medicare Other | Admitting: Nurse Practitioner

## 2016-04-06 VITALS — BP 133/72 | HR 60 | Ht 68.0 in | Wt 193.8 lb

## 2016-04-06 DIAGNOSIS — R269 Unspecified abnormalities of gait and mobility: Secondary | ICD-10-CM

## 2016-04-06 DIAGNOSIS — G20 Parkinson's disease: Secondary | ICD-10-CM | POA: Diagnosis not present

## 2016-04-06 MED ORDER — CARBIDOPA-LEVODOPA 25-100 MG PO TABS: 25.0000 | ORAL_TABLET | Freq: Every day | ORAL | Status: DC

## 2016-04-06 MED ORDER — ROPINIROLE HCL ER 12 MG PO TB24: 12.0000 mg | ORAL_TABLET | Freq: Every day | ORAL | Status: DC

## 2016-04-06 NOTE — Progress Notes (Signed)
[facility]  PATIENT: Steven Pitts DOB: [birth year]   REASON FOR VISIT: Follow-up for Parkinson's disease abnormality of gait HISTORY FROM: Patient    HISTORY OF PRESENT ILLNESS:Steven Pitts is a 70 year-old right-handed Caucasian male, alone at today's clinical visit. He has past medical history of essential hypertension, type 2 diabetes,obstructive sleep apnea, using CPAP machine, depression anxiety, he presented with 5 years history of gradual onset, slow worsening gait difficulty. At age 39, he began to experience bilateral hand tremor, voice tremor, especially when he is anxious,he was diagnosed with essential tremor, has been treated with propranolol since 1982, which has been very effective in controlling his hands and voice tremor.  However over the last 5 years, he began to experience, gait difficulty, he noticed decreases swing of his right arm,unsteadiness, and slowness. he was evaluated by [facility] Dr. Octaviano [facility], was started on Requip, titrating dose about 2 years ago. he was also he referred to [facility] Dr. Linus Pitts for secondary opinion, was started on Sinemet, the combination medication overall has been very helpful. I have switched him to requip Xr 67m q9pm, he tolerated it very well. He is also taking sinemet 25/100 at 7,10,14, 17. 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 [facility]. He has used CPAP machine for his sleep apnea for 20 years. His brother at age 68 died of aspiration, had Parkinson's disease for 3 years, rapid progression, required deep brain stimulator. His parents died of early 643s there was no Parkinsonism. He has two adult healthy children.  UPDATE April 06 2014: He exercise regularly, he has slight worsening since last year, mild gait difficulty, dragging his right leg, he  also noticed gradually worsening right arm stiffness, if he uses his left hand and arm. no dysphagia, mild slurred speech, he is taking sinemet 25/100 5 times a day, requip xr 136mqhs, doing very well, no significant side effect noticed.  UPDATE 07/13/2016[name] [age]ear old male returns for follow-up. He has history of Parkinson's disease for several years and essential tremor since the age of [age]He ambulates with a single-point cane, denies any falls. Currently on Sinemet 5 times a day and Requip extended release at bedtime. Denies side effects to his medications. He continues to exercise regularly 6 hours a week on the stationary bike and with flexibility bands. He takes about a 30 minute nap daily. He denies any weakness, any freezing spells, any difficulty turning over in bed, mild difficulty swallowing at times is used to taking small bites. He returns for reevaluation UPDATE 04/06/2016 [name] old male returns for follow-up. He has a history of Parkinson's disease for 8 years and essential tremor since the age of [age]He has had 2 falls in the last year and  no apparent injury. He ambulates with a single-point cane. He is currently on Sinemet 5 times a day and Requip extended release at bedtime. He denies any compulsive behaviors continues to exercise daily on stationary bike. He denies any freezing spells difficulty turning over in bed, continues to have mild swallowing problems at times was encouraged to double swallow. He returns for reevaluation  REVIEW OF SYSTEMS: Full 14 system review of systems performed and notable only for those listed, all others are neg:  Constitutional: neg  Cardiovascular: neg Ear/Nose/Throat: Drooling occasionally Skin: neg Eyes: neg Respiratory: neg  Gastroitestinal: neg  Hematology/Lymphatic: neg  Endocrine: neg Musculoskeletal:neg Allergy/Immunology: neg Neurological: Tremors, speech difficulty  Psychiatric: Depression Sleep :  Restless leg, daytime sleepiness   ALLERGIES: No Known Allergies  HOME MEDICATIONS: Outpatient Prescriptions Prior to Visit  Medication Sig Dispense Refill  . atorvastatin (LIPITOR) 20 MG tablet Take 20 mg by mouth daily.    . carbidopa-levodopa (SINEMET IR) 25-100 MG per tablet Take 25-100 tablets by mouth 5 (five) times daily. 150 tablet 12  . DULoxetine (CYMBALTA) 60 MG capsule Take 60 mg by mouth daily.    Marland Kitchen HYDROcodone-acetaminophen (NORCO) 10-325 MG per tablet Take 10-325 tablets by mouth daily.    . metFORMIN (GLUCOPHAGE) 1000 MG tablet Take 1,000 mg by mouth daily.    Marland Kitchen NEXIUM 40 MG capsule Take 40 mg by mouth daily.    . propranolol (INDERAL) 40 MG tablet Take 40 mg by mouth daily.    . Ropinirole HCl 12 MG TB24 Take 1 tablet (12 mg total) by mouth daily. 30 tablet 12  . telmisartan-hydrochlorothiazide (MICARDIS HCT) 80-25 MG per tablet Take 80 tablets by mouth daily.    Marland Kitchen glimepiride (AMARYL) 4 MG tablet Take 4 mg by mouth daily. Reported on 04/06/2016    . ibuprofen (ADVIL,MOTRIN) 800 MG tablet Take 800 mg by mouth daily. Reported on 04/06/2016    . INVOKANA 300 MG TABS Take 300 mg by mouth daily. Reported on 04/06/2016    . polyethylene glycol powder (GLYCOLAX/MIRALAX) powder Take 3,350 g by mouth daily. Reported on 04/06/2016     No facility-administered medications prior to visit.    PAST MEDICAL HISTORY: Past Medical History  Diagnosis Date  . High blood pressure   . Anxiety   . Diabetes ([place])   . Essential and other specified forms of tremor   . Other speech disturbance(784.59)   . Abnormality of gait   . Parkinson's disease ([place])     PAST SURGICAL HISTORY: Past Surgical History  Procedure Laterality Date  . Hernia repair    . Neck surgery      x4  . Rotator cuff repair Left     FAMILY HISTORY: Family History  Problem Relation Age of Onset  . Alcohol abuse Father   . Aneurysm Mother     SOCIAL HISTORY: Social History   Social History  . Marital  Status: Married    Spouse Name: N/A  . Number of Children: N/A  . Years of Education: N/A   Occupational History  . Not on file.   Social History Main Topics  . Smoking status: Former Smoker    Types: Cigarettes  . Smokeless tobacco: Never Used     Comment: quit 1980  . Alcohol Use: No     Comment: quit in 1980  . Drug Use: No  . Sexual Activity: Not on file   Other Topics Concern  . Not on file   Social History Narrative   Patient is a Art gallery manager for [employer]. Patient has a college education. Patient is married to Bethena Roys) for 37 years.     PHYSICAL EXAM  Filed Vitals:   04/06/16 1255  BP: 133/72  Pulse: 60  Height: _0  (1.727 m)  Weight: 193 lb 12.8 oz (87.907 kg)   Body mass index is 29.47 kg/(m^2). General: Well-developed well-nourished in no distress, well groomed mild masking of the face Neck: supple no carotid bruits Musculoskeletal no deformity Neurologic Exam  Mental Status: Alert and appropriate follows all commands, has neck tilt to left. Mild  dysphonia Cranial Nerves: CN II-XII pupils were equal round reactive to light. Extraocular movements were full. Visual fields were full on confrontational test. Facial sensation and strength were normal. Hearing was intact to finger rubbing bilaterally. Uvula tongue were midline. Head turning and shoulder shrugging were normal and symmetric. Tongue protrusion into the cheeks strength were normal. Myerson sign negative Motor: no significant rigidity or bradykinesia, no weakness. Sensory: Normal to light touch Coordination: there was finger to nose and heel to shin dysmetria, mild to moderate trunk ataxia Gait and Station: stand up from seated postion without difficulty, no difficulty to initiate gait, head tilt to left side, wide based, fairly steady moderate stride, smooth turning ambulated 210 feet in 55 seconds . Reflexes: Deep tendon reflexes: hyperactive and symmetric, patellar 3/3 Plantar responses are  flexor.  DIAGNOSTIC DATA (LABS, IMAGING, TESTING) -  ASSESSMENT AND PLAN 70 y.o. year old male has a past medical history of dystonia, dysphonia mild parkinsonism , has responded well to dopamine agents.  Continue carbidopa levodopa at current dose will refill 5am, 9, 12n,4and 8pm Continue Requip at current dose will refill Continue to use cane at all times for safe ambulation Continue exercise program ,stationary bike and flexibility exercises Follow-up yearly and when necessary Dennie Bible, [facility], [facility], APRN  [facility] [address] [place], [place] [address] [phone]

## 2016-04-06 NOTE — Patient Instructions (Signed)
Continue carbidopa levodopa at current dose will refill take at 5am, 9a, 12noon, 4 and 8 pm Continue Requip at current dose will refill Continue to use cane at all times for safe ambulation Continue exercise program ,stationary bike and flexibility exercises Follow-up yearly and when necessary

## 2016-04-06 NOTE — Progress Notes (Signed)
I have reviewed and agreed above plan. 

## 2016-07-25 ENCOUNTER — Encounter: Payer: Self-pay | Admitting: Neurology

## 2016-08-01 ENCOUNTER — Telehealth: Payer: Self-pay | Admitting: *Deleted

## 2016-08-01 NOTE — Telephone Encounter (Signed)
Report received from [employer] (collected on 07/25/16):  FINAL MICROSCOPIC DIAGNOSIS: A.  Esophagus, Middle Third, Lower Third, Biopsy: - BARRETT'S MUCOSA - Intestinal (goblet cell) metaplasia identified   (Alcian Blue/PAS stain examined) - No dysplasia identified  CLINICAL INDICATIONS/HISTORY: Epigastric abdominal pain  CLINICAL FINDINGS/SPECIAL REQUESTS: A. Esophagus Middle Third, Lower Third - R/0 Barrett's  Esophageal mucosal changes classified as Barrett's stage C4-M5 per [place] criteria.  Biopsied.  Multiple gastric polyps.  Acquired duidenal stenosis. Dilated.

## 2017-01-10 ENCOUNTER — Telehealth: Payer: Self-pay | Admitting: *Deleted

## 2017-01-10 NOTE — Telephone Encounter (Signed)
Spoke with Aaron Edelman, pharmacist, [facility]: fax received that carb/levo is on back order. He stated he has found another supplier. He called patient, and patient stated he has enough medication until Friday when Aaron Edelman can refill for one month.  Aaron Edelman stated this RN may disregard fax.

## 2017-04-06 ENCOUNTER — Ambulatory Visit (INDEPENDENT_AMBULATORY_CARE_PROVIDER_SITE_OTHER): Payer: Medicare Other | Admitting: Nurse Practitioner

## 2017-04-06 ENCOUNTER — Encounter: Payer: Self-pay | Admitting: *Deleted

## 2017-04-06 VITALS — BP 132/73 | HR 56 | Wt 212.6 lb

## 2017-04-06 DIAGNOSIS — Z9989 Dependence on other enabling machines and devices: Secondary | ICD-10-CM | POA: Diagnosis not present

## 2017-04-06 DIAGNOSIS — R251 Tremor, unspecified: Secondary | ICD-10-CM | POA: Diagnosis not present

## 2017-04-06 DIAGNOSIS — G4733 Obstructive sleep apnea (adult) (pediatric): Secondary | ICD-10-CM | POA: Diagnosis not present

## 2017-04-06 DIAGNOSIS — R269 Unspecified abnormalities of gait and mobility: Secondary | ICD-10-CM | POA: Diagnosis not present

## 2017-04-06 DIAGNOSIS — G20 Parkinson's disease: Secondary | ICD-10-CM

## 2017-04-06 MED ORDER — CARBIDOPA-LEVODOPA 25-100 MG PO TABS: 1.0000 | ORAL_TABLET | Freq: Every day | ORAL | 11 refills | Status: DC

## 2017-04-06 MED ORDER — ROPINIROLE HCL ER 12 MG PO TB24: 12.0000 mg | ORAL_TABLET | Freq: Every day | ORAL | 12 refills | Status: DC

## 2017-04-06 NOTE — Progress Notes (Signed)
I have read the note, and I agree with the clinical assessment and plan.  Lenor Coffin

## 2017-04-06 NOTE — Progress Notes (Signed)
[facility]  PATIENT: Steven Pitts DOB: Mar 20, 1946   REASON FOR VISIT: Follow-up for Parkinson's disease abnormality of gait, daytime drowsiness HISTORY FROM: Patient    HISTORY OF PRESENT ILLNESS:Steven Pitts is a 71 year-old right-handed Caucasian male, alone at today's clinical visit. He has past medical history of essential hypertension, type 2 diabetes,obstructive sleep apnea, using CPAP machine, depression anxiety, he presented with 5 years history of gradual onset, slow worsening gait difficulty. At age 24, he began to experience bilateral hand tremor, voice tremor, especially when he is anxious,he was diagnosed with essential tremor, has been treated with propranolol since 1982, which has been very effective in controlling his hands and voice tremor.  However over the last 5 years, he began to experience, gait difficulty, he noticed decreases swing of his right arm,unsteadiness, and slowness. he was evaluated by [facility] neurologic Dr. Octaviano Pitts, was started on Requip, titrating dose about 2 years ago. he was also he referred to [facility] Dr. Linus Pitts for secondary opinion, was started on Sinemet, the combination medication overall has been very helpful. I have switched him to requip Xr 83m q9pm, he tolerated it very well. He is also taking sinemet 25/100 at 7,10,14, 17. He montors the Steven Pitts  very activing, dowing well at his job, happy with current regime. [facility] was normal. He has used CPAP machine for his sleep apnea for 20 years. His brother at age 252 died of aspiration, had Parkinson's disease for 3 years, rapid progression, required deep brain stimulator. His parents died of early 681s there was no Parkinsonism. He has two adult healthy children.  UPDATE April 06 2014: He exercise regularly, he has slight worsening since last year, mild gait difficulty,  dragging his right leg, he also noticed gradually worsening right arm stiffness, if he uses his left hand and arm. no dysphagia, mild slurred speech, he is taking sinemet 25/100 5 times a day, requip xr 172mqhs, doing very well, no significant side effect noticed.  UPDATE 07/13/2016[name] returns for follow-up. He has history of Parkinson's disease for several years and essential tremor since the age of [age]He ambulates with a single-point cane, denies any falls. Currently on Sinemet 5 times a day and Requip extended release at bedtime. Denies side effects to his medications. He continues to exercise regularly 6 hours a week on the stationary bike and with flexibility bands. He takes about a 30 minute nap daily. He denies any weakness, any freezing spells, any difficulty turning over in bed, mild difficulty swallowing at times is used to taking small bites. He returns for reevaluation UPDATE 04/06/2016 [name] returns for follow-up. He has a history of Parkinson's disease for 8 years and essential tremor since the age of [age]He has had 2 falls in the last year and  no apparent injury. He ambulates with a single-point cane. He is currently on Sinemet 5 times a day and Requip extended release at bedtime. He denies any compulsive behaviors continues to exercise daily on stationary bike. He denies any freezing spells difficulty turning over in bed, continues to have mild swallowing problems at times was encouraged to double swallow. He returns for reevaluation UPDATE 07/13/2018[name] returns for follow-up with a nine-year history of Parkinson's disease. His essential tremor since the age of [age]He has had one fall in the last year no  apparent injury. He continues to ambulate with a single point cane. Recent cataract surgery right and due for  left eye next week. He remains on Sinemet 5 times a day and Requip extended release at that time.  He he denies any compulsive behaviors. Continues to exercise on stationary bike. He continues to have mild problems with swallowing and double swallows when eating no freezing spells, no difficulty turning over in bed. He has a long history of obstructive sleep apnea and complains of more daytime drowsiness and fatigue recently. Denies nocturia. His last sleep study was in 2008 and he has an old machine. He claims he is compliant with this but would like to have a new study and an updated equipment. He returns for reevaluation  REVIEW OF SYSTEMS: Full 14 system review of systems performed and notable only for those listed, all others are neg:  Constitutional: Fatigue  Cardiovascular: neg Ear/Nose/Throat: Drooling occasionally Skin: neg Eyes: neg Respiratory: neg Gastroitestinal: neg  Hematology/Lymphatic: neg  Endocrine: neg Musculoskeletal:neg Allergy/Immunology: neg Neurological: Tremors, speech difficulty  Psychiatric: Depression Sleep : Restless leg, daytime sleepiness, obstructive sleep apnea   ALLERGIES: No Known Allergies  HOME MEDICATIONS: Outpatient Medications Prior to Visit  Medication Sig Dispense Refill  . atorvastatin (LIPITOR) 20 MG tablet Take 20 mg by mouth daily.    . carbidopa-levodopa (SINEMET IR) 25-100 MG tablet Take 25-100 tablets by mouth 5 (five) times daily. 150 tablet 12  . DULoxetine (CYMBALTA) 60 MG capsule Take 60 mg by mouth daily.    . empagliflozin (JARDIANCE) 25 MG TABS tablet Take 25 mg by mouth daily.    Marland Kitchen HYDROcodone-acetaminophen (NORCO) 10-325 MG per tablet Take 10-325 tablets by mouth daily.    . insulin glargine (LANTUS) 100 UNIT/ML injection Inject 30 Units into the skin daily.    . metFORMIN (GLUCOPHAGE) 1000 MG tablet Take 1,000 mg by mouth daily.    Marland Kitchen NEXIUM 40 MG capsule Take 40 mg by mouth daily.    . propranolol (INDERAL) 40 MG tablet Take 40 mg by mouth daily.    . Ropinirole HCl 12 MG TB24 Take 1 tablet (12 mg total) by mouth daily.  30 tablet 12  . telmisartan-hydrochlorothiazide (MICARDIS HCT) 80-25 MG per tablet Take 80 tablets by mouth daily.     No facility-administered medications prior to visit.     PAST MEDICAL HISTORY: Past Medical History:  Diagnosis Date  . Abnormality of gait   . Anxiety   . Diabetes ([place])   . Essential and other specified forms of tremor   . High blood pressure   . Other speech disturbance(784.59)   . Parkinson's disease ([place])     PAST SURGICAL HISTORY: Past Surgical History:  Procedure Laterality Date  . cataract surgery Right   . HERNIA REPAIR    . NECK SURGERY     x4  . ROTATOR CUFF REPAIR Left     FAMILY HISTORY: Family History  Problem Relation Age of Onset  . Aneurysm Mother   . Alcohol abuse Father   . Parkinson's disease Brother     SOCIAL HISTORY: Social History   Social History  . Marital status: Married    Spouse name: Bethena Roys  . Number of children: N/A  . Years of education: college   Occupational History  .      [employer], Art gallery manager   Social History Main Topics  . Smoking status: Former Smoker    Types: Cigarettes  . Smokeless tobacco: Never Used  Comment: quit 1980  . Alcohol use No     Comment: quit in 1980  . Drug use: No  . Sexual activity: Not on file   Other Topics Concern  . Not on file   Social History Narrative   Patient is a Art gallery manager for [employer]. Patient has a college education. Patient is married to Bethena Roys) for 37 years.     PHYSICAL EXAM  Vitals:   04/06/17 1117  BP: 132/73  Pulse: (!) 56  Weight: 212 lb 9.6 oz (96.4 kg)   Body mass index is 32.33 kg/m. General: Well-developed well-nourished in no distress, well groomed mild masking of the face Neck: supple no carotid bruits Musculoskeletal no deformity Neurologic Exam  Mental Status: Alert and appropriate follows all commands, has neck tilt to left. Mild dysphonia Cranial Nerves: CN II-XII pupils were equal round reactive to light. Extraocular movements  were full. Visual fields were full on confrontational test.Decreased blink Facial sensation and strength were normal. Hearing was intact to finger rubbing bilaterally. Uvula tongue were midline. Head turning and shoulder shrugging were normal and symmetric. Tongue protrusion into the cheeks strength were normal. Myerson sign negative Motor: no significant rigidity or bradykinesia, no weakness. Sensory: Normal to light touch Coordination: there was finger to nose and heel to shin dysmetria,  Gait and Station: stand up from seated postion without difficulty, no difficulty to initiate gait, head tilt to left side, wide based, fairly steady moderate stride, smooth turning ambulated 210 feet in 50 seconds . Reflexes: Deep tendon reflexes: hyperactive and symmetric, patellar 3/3 Plantar responses are flexor.  DIAGNOSTIC DATA (LABS, IMAGING, TESTING) -  ASSESSMENT AND PLAN 71 y.o. year old male has a past medical history of dystonia, dysphonia mild parkinsonism , has responded well to dopamine agents. More daytime drowsiness and fatigue, last sleep study in 2008. The patient is a current patient of Dr. Krista Blue who is out of the office today . This note is sent to the work in doctor.     Continue carbidopa levodopa at current dose will refill 5am, 9, 12n,4and 8pm Continue Requip at current dose will refill Continue to use cane at all times for safe ambulation Continue exercise program ,stationary bike and flexibility exercises Will obtain sleep evaluation last sleep study 2008.  Follow-up yearly and when necessary next with Dr. Luan Pulling, [facility], [facility], APRN  [facility] [address], [place], [place] [address] [phone]

## 2017-04-06 NOTE — Patient Instructions (Signed)
Continue carbidopa levodopa at current dose will refill 5am, 9, 12n,4and 8pm Continue Requip at current dose will refill Continue to use cane at all times for safe ambulation Continue exercise program ,stationary bike and flexibility exercises Will obtain sleep evaluation last sleep study 2008.  Follow-up yearly and when necessary next with Dr. Krista Blue

## 2017-05-08 ENCOUNTER — Encounter: Payer: Self-pay | Admitting: Neurology

## 2017-05-08 ENCOUNTER — Ambulatory Visit (INDEPENDENT_AMBULATORY_CARE_PROVIDER_SITE_OTHER): Payer: Medicare Other | Admitting: Neurology

## 2017-05-08 VITALS — BP 142/82 | HR 59 | Ht 68.0 in | Wt 212.0 lb

## 2017-05-08 DIAGNOSIS — G20 Parkinson's disease: Secondary | ICD-10-CM | POA: Diagnosis not present

## 2017-05-08 DIAGNOSIS — G4733 Obstructive sleep apnea (adult) (pediatric): Secondary | ICD-10-CM

## 2017-05-08 DIAGNOSIS — G4731 Primary central sleep apnea: Secondary | ICD-10-CM | POA: Diagnosis not present

## 2017-05-08 DIAGNOSIS — G2581 Restless legs syndrome: Secondary | ICD-10-CM | POA: Diagnosis not present

## 2017-05-08 DIAGNOSIS — Z9989 Dependence on other enabling machines and devices: Secondary | ICD-10-CM

## 2017-05-08 DIAGNOSIS — F1190 Opioid use, unspecified, uncomplicated: Secondary | ICD-10-CM

## 2017-05-08 DIAGNOSIS — R0601 Orthopnea: Secondary | ICD-10-CM

## 2017-05-08 NOTE — Progress Notes (Signed)
SLEEP MEDICINE CLINIC   Provider:  Larey Pitts, M D  Primary Care Physician:  Steven Battles, MD   Referring Provider: Leanna Battles, MD   Chief Complaint  Patient presents with  . New Patient (Initial Visit)    pt alone. referred for sleep. pt had a sleep study was last done in 08, started using cpap in 95. pt doesnt feel like the cpap machine is working well. he has had his current one for 10-11 years.     HPI:  Steven Pitts is a 71 y.o. male , originally from [place] with [place] parents, seen here as in a referral/ revisit  from Dr. Philip Aspen Lynelle Pitts  colleague Dr.  Krista Pitts. It was our mutual nurse practitioner, Steven Pitts, who referred the patient to me for a CPAP reevaluation.  Steven Pitts has been a CPAP user ever since being diagnosed with sleep apnea in the year 1995, and is currently using his second CPAP machine which she owns for about 10 years. He was originally evaluated for sleep apnea because his wife was bothered by his snoring. He has been a compliant CPAP user ever since.  Steven Pitts begun experiencing tremors at age 56, there were first bilateral hand tremors, voice tremors and the tremors amplitude rose with no velocity or under stressful situations. He also developed a vocal tremor. Then ,in  2010 he began to experience gait difficulties lost his right arm swing, became unsteady and slower,  was evaluated at first  in [facility] and started on Requip, afterwards he was referred to [facility] and saw Dr. Gerald Pitts for a second opinion, was started on Sinemet and this combination therapy has been very helpful. He was evaluated for a deep brain stimulator but did not take them up on the offer. He continued to exercise regularly, he is meanwhile retired, he has by now a 8-9 year history of Parkinson's disease, and an almost lifelong history of essential tremors.    Sleep habits are as follows: he goes to bed between 11 and 12, he falls asleep  promptly, he seldomly goes to the bathroom at night and wakes spontaneously up by 5:30 AM. His bedroom is cool, quiet and dark, he sleeps  on one pillow usually on his side or on his back, he uses a mechanically adjustable bed and lifts to about 20 degrees. In spite of getting 4-5 hours of sleep at night he feels that he is not refreshed and restored in the morning. There has been no breakthrough snoring noted, he would like to sleep longer but he can't and he doesn't know why. He has tried to go to bed earlier and then just gets up earlier. He has not enacted dreams.    Sleep medical history and family sleep history:  PTSD, PD - only brother died of PD. no family history of sleep disorders at least not known, the patient had no sleep complaints in childhood and young adulthood, he was not a sleep walker, no history of night terrors no history of REM behavior disorder. He had an anterior fusion, cervical spine- left access 2002.  Social history: He begun snoring as an adult. He is retired used to work for [employer]. He is married, college educated, quit smoking in 1980 quit: 1980 is a former cigarette smoker, caffeine use is limited-1 cup of coffee in the morning, he may drink a diet soda in the daytime. No history of shift work in the last 20 years. Since he retired  from the [employer] he got a college degree.    Review of Systems: Out of a complete 14 system review, the patient complains of only the following symptoms, and all other reviewed systems are negative.  Steven Pitts endorsed trouble swallowing, fatigue, constipation, snoring, joint pain and aching muscles, depression and anxiety reflected in a depression score and of 11 out of 15 points, sometimes slurred speech dysphonia difficulty swallowing saliva and sometimes restless legs. He has a diagnosis of Parkinson's disease, Parkinson +20 stress disorder, essential tremors, diabetes, high cholesterol, he endorsed the Epworth sleepiness score at 8 point  and fatigue severity at 43 points while on CPAP.     Social History   Social History  . Marital status: Married    Spouse name: Steven Pitts  . Number of children: N/A  . Years of education: college   Occupational History  .      [employer], Art gallery manager   Social History Main Topics  . Smoking status: Former Smoker    Types: Cigarettes  . Smokeless tobacco: Never Used     Comment: quit 1980  . Alcohol use No     Comment: quit in 1980  . Drug use: No  . Sexual activity: Not on file   Other Topics Concern  . Not on file   Social History Narrative   Patient is a Art gallery manager for [employer]. Patient has a college education. Patient is married to Steven Pitts) for 37 years.    Family History  Problem Relation Age of Onset  . Aneurysm Mother   . Alcohol abuse Father   . Parkinson's disease Brother     Past Medical History:  Diagnosis Date  . Abnormality of gait   . Anxiety   . Diabetes ([place])   . Essential and other specified forms of tremor   . High blood pressure   . Other speech disturbance(784.59)   . Parkinson's disease [facility])     Past Surgical History:  Procedure Laterality Date  . cataract surgery Right   . HERNIA REPAIR    . NECK SURGERY     x4  . ROTATOR CUFF REPAIR Left     Current Outpatient Prescriptions  Medication Sig Dispense Refill  . atorvastatin (LIPITOR) 20 MG tablet Take 20 mg by mouth daily.    . carbidopa-levodopa (SINEMET IR) 25-100 MG tablet Take 1 tablet by mouth 5 (five) times daily. 150 tablet 11  . DULoxetine (CYMBALTA) 60 MG capsule Take 60 mg by mouth daily.    . empagliflozin (JARDIANCE) 25 MG TABS tablet Take 25 mg by mouth daily.    Marland Kitchen HYDROcodone-acetaminophen (NORCO) 10-325 MG per tablet Take 10-325 tablets by mouth daily.    . insulin glargine (LANTUS) 100 UNIT/ML injection Inject 30 Units into the skin daily.    . meloxicam (MOBIC) 7.5 MG tablet Take 7.5 mg by mouth 2 (two) times daily.    . metFORMIN (GLUCOPHAGE) 1000 MG tablet Take  1,000 mg by mouth daily.    Marland Kitchen NEXIUM 40 MG capsule Take 40 mg by mouth daily.    . propranolol (INDERAL) 40 MG tablet Take 40 mg by mouth daily.    . Ropinirole HCl 12 MG TB24 Take 1 tablet (12 mg total) by mouth daily. 30 tablet 12  . telmisartan-hydrochlorothiazide (MICARDIS HCT) 80-25 MG per tablet Take 80 tablets by mouth daily.     No current facility-administered medications for this visit.     Allergies as of 05/08/2017  . (No Known Allergies)  Vitals: BP (!) 142/82   Pulse (!) 59   Ht _0  (1.727 m)   Wt 212 lb (96.2 kg)   BMI 32.23 kg/m  Last Weight:  Wt Readings from Last 1 Encounters:  05/08/17 212 lb (96.2 kg)   ZJI:RCVE mass index is 32.23 kg/m.     Last Height:   Ht Readings from Last 1 Encounters:  05/08/17 _1  (1.727 m)    Physical exam:  General: The patient is awake, alert and appears not in acute distress. The patient is well groomed. Head: Normocephalic, atraumatic. Neck is supple. Mallampati 4, prognathia, cross bite. ,  neck circumference: 18.5 . Nasal airflow patent ,   Cardiovascular:  Regular rate and rhythm , without  murmurs or carotid bruit, and without distended neck veins. Respiratory: Lungs are clear to auscultation. Skin:  Without evidence of edema, or rash Trunk: BMI is 32 . The patient's posture is stooped   Neurologic exam : The patient is awake and alert, oriented to place and time.   dysphonia Mood and affect are appropriate.  Cranial nerves: Pupils are equal and briskly reactive to light. Funduscopic exam without evidence of pallor or edema. Extraocular movements  in vertical and horizontal planes intact and without nystagmus. Visual fields by finger perimetry are intact. Hearing to finger rub intact.   Facial sensation intact to fine touch.  Facial motor strength is symmetric and tongue and uvula move midline. Shoulder shrug was symmetrical.   Motor exam:   Extreme rigidity- cog wheeling over both biceps.  muscle bulk and  symmetric strength=  Sensory:  Fine touch, pinprick and vibration were normal.  Coordination: Finger-to-nose maneuver  normal with evidence of dysmetria or tremor.  Gait and station: Patient walks without assistive device . Strength within normal limits.  Stance is stable and normal.  Turns with 4  Steps. Romberg testing is positive.   Deep tendon reflexes: in the  upper and lower extremities are brisk, symmetric and intact.    Assessment:  After physical and neurologic examination, review of laboratory studies,  Personal review of imaging studies, reports of other /same  Imaging studies, results of polysomnography and / or neurophysiology testing and pre-existing records as far as provided in visit., my assessment is   1) given that Mr. Manley suffers from [place] now in his 10th year he has a higher risk of REM behavior disorder, obstructive as well as central sleep apnea. Chronic progressive degenerative diseases of the brain usually go hand in hand with the development of central apneas, sometimes treatment emergent under CPAP. 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 I will invite Mr. Hutchinson to be established as a patient here, I will obtain a baseline diagnostic split-night study protocol that allows me to treat with CPAP the same day. If he cannot tolerate CPAP or assistive CPAP pressures cannot be fully titrated, he may return for a full night titration. In the meantime I like for him to continue using the CPAP which she describes is fully functional. My goal is usually to switch to a CPAP that was also titration capable.  The patient was advised of the nature of the diagnosed disorder , the treatment options and the  risks for general health and wellness arising from not treating the condition.   I spent more than  45 minutes of face to face  time with the  patient.  Greater than 50% of time was spent in counseling and coordination of care. We have discussed the diagnosis and differential and I answered the patient's questions.    Plan:  Treatment plan and additional workup : SPLIT , no capnography needed. patient is not longer used to sleep without CPAP. Ig f he cannot initiate sleep, allow him CPAP after 60 minutes and re titrate.    Steven Seat, MD 3/66/2947, 6:54 PM  Certified in Neurology by ABPN Certified in [place] by [facility] [address], [place], [place] [address]

## 2017-05-08 NOTE — Patient Instructions (Signed)

## 2017-05-23 ENCOUNTER — Ambulatory Visit (INDEPENDENT_AMBULATORY_CARE_PROVIDER_SITE_OTHER): Payer: Medicare Other | Admitting: Neurology

## 2017-05-23 DIAGNOSIS — Z9989 Dependence on other enabling machines and devices: Secondary | ICD-10-CM

## 2017-05-23 DIAGNOSIS — G4733 Obstructive sleep apnea (adult) (pediatric): Secondary | ICD-10-CM

## 2017-05-23 DIAGNOSIS — G2581 Restless legs syndrome: Secondary | ICD-10-CM

## 2017-05-23 DIAGNOSIS — R0601 Orthopnea: Secondary | ICD-10-CM

## 2017-05-23 DIAGNOSIS — G4731 Primary central sleep apnea: Secondary | ICD-10-CM

## 2017-05-23 DIAGNOSIS — F1190 Opioid use, unspecified, uncomplicated: Secondary | ICD-10-CM

## 2017-05-25 ENCOUNTER — Telehealth: Payer: Self-pay | Admitting: Neurology

## 2017-05-25 NOTE — Procedures (Signed)
PATIENT'S NAME:  Steven Pitts, Steven Pitts DOB:      1945/12/27      MR#:    [id number]     DATE OF RECORDING: 05/23/2017 REFERRING M.D.:  Leanna Battles, M.D. via Dr. Krista Blue Study Performed:  Split-Night Titration Study HISTORY: Anxiety, High blood pressure, Parkinson's disease, and Complex sleep apnea syndrome. Patient is a CPAP user since 1995, who has used his current ( second) CPAP machine for 10 years .  He may need a new setting, a new modality and machine.  The patient endorsed the Epworth Sleepiness Scale at 8/24 points, FSS: 43, Geriatric Depression: 11/15 points. The patient's weight 212 pounds with a height of 68 (inches), resulting in a BMI of 32.1 kg/m2. The patient's neck circumference measured 18.5 inches.  CURRENT MEDICATIONS: Lipitor, Sinemet, Cymbalta, Jardiance, Norco, Lantus, Mobic, Glucophage, Nexium, Inderal, Ropinirole, Micardis.    PROCEDURE:  This is a multichannel digital polysomnogram utilizing the SomnoStar 11.2 system.  Electrodes and sensors were applied and monitored per AASM Specifications.   EEG, EOG, Chin and Limb EMG, were sampled at 200 Hz.  ECG, Snore and Nasal Pressure, Thermal Airflow, Respiratory Effort, CPAP Flow and Pressure, Oximetry was sampled at 50 Hz. Digital video and audio were recorded.      BASELINE STUDY WITHOUT CPAP RESULTS: Lights Out was at 22:36 and Lights On at 04:32.  Total recording time (TRT) was 154.5, with a total sleep time (TST) of 110.5 minutes.   The patient's sleep latency was 11.5 minutes.  The sleep efficiency was 71.5 %.    SLEEP ARCHITECTURE: WASO (Wake after sleep onset) was 16.5 minutes, Stage N1 was 11.5 minutes, Stage N2 was 99 minutes, Stage N3 was 0 minutes and Stage R (REM sleep) was 0 minutes.  The percentages were Stage N1 10.4%, Stage N2 89.6%. Neither N3 nor REM sleep were noted.   RESPIRATORY ANALYSIS:  There were a total of 93 respiratory events:  20 obstructive apneas, 3 central apneas and 0 mixed apneas with a total of 23  apneas and an apnea index (AI) of 12.5. There were 70 hypopneas with a hypopnea index of 38.. The patient also had 0 respiratory event related arousals (RERAs) but snoring was noted.     The total APNEA/HYPOPNEA INDEX (AHI) was 50.5 /hour and the total RESPIRATORY DISTURBANCE INDEX was 50.5 /hour.  0 events occurred in REM sleep and 143 events in NREM. The REM AHI was 0, /hour versus a non-REM AHI of 50.5 /hour. The patient spent 274 minutes sleep time in the supine position 0 minutes in non-supine. The supine AHI was 50.5 /hour versus a non-supine AHI of 0.0 /hour.  OXYGEN SATURATION & C02:  The wake baseline 02 saturation was 96%, with the lowest being 86%. Time spent below 89% saturation equaled 7 minutes.  PERIODIC LIMB MOVEMENTS:   The patient had a total of 109 Periodic Limb Movements.  The Periodic Limb Movement (PLM) index was 59.2 /hour and the PLM Arousal index was 11.4 /hour. The arousals were noted as: 15 were spontaneous, 21 were associated with PLMs, and 45 were associated with respiratory events. Audio and video analysis did not show any abnormal or unusual movements, behaviors, phonations or vocalizations. Since REM sleep was not reached, there was no correlation of PLMs to REM BD made. EKG was in keeping with normal sinus rhythm (NSR)   TITRATION STUDY WITH CPAP RESULTS:   CPAP was initiated at 5 cmH20 with heated humidity per AASM split night standards and pressure  was step by step advanced to 11 cmH20 because of hypopneas, apneas and desaturations.  At a PAP pressure of 11 cmH20, there was a reduction of the AHI to 0.0 /hour, but the patient only slept 6 minutes ! The previous pressure of 10 cm CPAP was associated with an AHI of 52.8/hr.    Total recording time (TRT) was 201.5 minutes, with a total sleep time (TST) of 163.5 minutes. The patient's sleep latency was 7 minutes. REM latency was 71.5 minutes.  The sleep efficiency was 81.1 %.    SLEEP ARCHITECTURE: Wake after sleep  was 31 minutes, Stage N1 12.5 minutes, Stage N2 117 minutes, Stage N3 31 minutes and Stage R (REM sleep) 3 minutes. The percentages were: Stage N1 7.6%, Stage N2 71.6%, Stage N3 19.% and Stage R (REM sleep) 1.8%. The sleep architecture was notable for very brief REM periods.  RESPIRATORY ANALYSIS:  There were a total of 53 respiratory events: 11 obstructive apneas, 10 central apneas and 0 mixed apneas with a total of 21 apneas and an apnea index (AI) of 7.7. There were 32 hypopneas with a hypopnea index of 11.7 /hour. The patient also had 0 respiratory event related arousals (RERAs).     The total APNEA/HYPOPNEA INDEX (AHI) was 19.4 /hour and the total RESPIRATORY DISTURBANCE INDEX was 19.4 /hour.  1 events occurred in REM sleep and 52 events in NREM. The REM AHI was 20 /hour versus a non-REM AHI of 19.4 /hour. The patient spent 100% of total sleep time in the supine position. The supine AHI was 19.4 /hour, versus a non-supine AHI of 0.0/hour.  OXYGEN SATURATION & C02:  The wake baseline 02 saturation was 92%, with the lowest being 83%. Time spent below 89% saturation equaled 11 minutes.  PERIODIC LIMB MOVEMENTS:   The patient had a total of 0 Periodic Limb Movements.  The arousals were noted as: 11 were spontaneous, 0 were associated with PLMs, and 15 were still associated with respiratory events. Central apnea emerged under treatment.  Post-study, the patient indicated that sleep was shorter than usual.  POLYSOMNOGRAPHY IMPRESSION :   1. Severe Obstructive Sleep Apnea (OSA) without significant hypoxemia was the baseline diagnosis. 2. There were many PLMs and related arousals during the diagnostic part of the SPLIT protocol. 3. CPAP did resolve PLMs but created central sleep apnea. The oxygen nadir rose under CPAP. 4. Titration was incomplete- sleep under the final CPAP pressure of 11 cm water was very brief.     RECOMMENDATIONS:  1. The patient obviously had problems with insomnia, adjusting  to PAP device during titration, but has used CPAP for many years at home. I will ask him to return for a full night titration, with the option of changing to BiPAP. BiPAP therapy is better tolerated when higher pressures are needed. I will ask the technologist to start with a BiPAP at 8/4 and titrate from there. The patient indicated discomfort with the mask- He may want to bring his own from home.   2. A follow up appointment will be scheduled in the Sleep Clinic at [facility]. 3. Please call us at [phone] if you have scheduling questions.      I certify that I have reviewed the entire raw data recording prior to the issuance of this report in accordance with the Standards of Accreditation of the [place] [employer] of [employer] (AASM)     Larey Seat, M.D.    05-25-2017  [place], [place] Board  of Psychiatry and Neurology  Diplomat, American Board of Sleep Medicine Market researcher, [place] [facility] at [employer]

## 2017-05-25 NOTE — Telephone Encounter (Signed)
-----  Message from Larey Seat, MD sent at 05/25/2017 10:43 AM EDT ----- There was no sufficient resolution of apnea under CPAP, central apneas emerged. AHI under CPAP 10 cm exceeded the untreated apnea AHI.  The patient will be coming back for full night titration and change to BiPAP therapy. No oxygen was needed.  AHI under CPAP 10 cm exceeded the untreated apnea AHI.  CD

## 2017-05-25 NOTE — Addendum Note (Signed)
Addended by: Larey Seat on: 05/25/2017 10:43 AM   Modules accepted: Orders

## 2017-05-25 NOTE — Telephone Encounter (Signed)
Called and discussed the sleep study with the patient pt verbalized understanding and understand that the sleep lab will be in contact with him to bring him in for another sleep study titration. We will try him on Bipap. Pt verbalized understanding and had no further questions at this time.

## 2017-06-12 ENCOUNTER — Ambulatory Visit (INDEPENDENT_AMBULATORY_CARE_PROVIDER_SITE_OTHER): Payer: Medicare Other | Admitting: Neurology

## 2017-06-12 DIAGNOSIS — G4733 Obstructive sleep apnea (adult) (pediatric): Secondary | ICD-10-CM | POA: Diagnosis not present

## 2017-06-12 DIAGNOSIS — R0601 Orthopnea: Secondary | ICD-10-CM

## 2017-06-12 DIAGNOSIS — Z9989 Dependence on other enabling machines and devices: Secondary | ICD-10-CM

## 2017-06-12 DIAGNOSIS — G4731 Primary central sleep apnea: Secondary | ICD-10-CM | POA: Diagnosis not present

## 2017-06-12 DIAGNOSIS — F1190 Opioid use, unspecified, uncomplicated: Secondary | ICD-10-CM

## 2017-06-12 DIAGNOSIS — G2581 Restless legs syndrome: Secondary | ICD-10-CM

## 2017-06-15 ENCOUNTER — Encounter: Payer: Self-pay | Admitting: Nurse Practitioner

## 2017-06-15 NOTE — Addendum Note (Signed)
Addended by: Larey Seat on: 06/15/2017 11:45 AM   Modules accepted: Orders

## 2017-06-15 NOTE — Procedures (Signed)
PATIENT'S NAME:  Steven Pitts, Steven Pitts DOB:      1945/10/03      MR#:    [id number]     DATE OF RECORDING: 06/12/2017 REFERRING M.D.:  Leanna Battles, M.D. Study Performed:   BiPAP Titration HISTORY: Anxiety, High blood pressure, Parkinson's disease, and Complex sleep apnea syndrome.   Patient is a CPAP user since 1995, who has used his current (second) CPAP machine for 10 years. He may need a new setting, a new modality and machine.  The total APNEA/HYPOPNEA INDEX (AHI) was 50.5 /hour and all 143 events took place in NREM. The REM AHI was 0, /hour versus a non-REM AHI of 50.5 /hour. CPAP was initiated at 5 cmH20 with heated humidity per AASM split night standards and pressure was step by step advanced to 11 cmH20 because of hypopneas, apneas and desaturations.  The pressure of 10 cm CPAP was associated with an AHI of 52.8/hr.    The patient endorsed the Epworth Sleepiness Scale at 8/24 points, FSS: 43, Geriatric Depression: 11/15 points. The patient's weight 212 pounds with a height of 68 (inches), resulting in a BMI of 32.1 kg/m2. The patient's neck circumference measured 18.5 inches.  CURRENT MEDICATIONS: Lipitor, Sinemet, Cymbalta, Jardiance, Norco, Lantus, Mobic, Glucophage, Nexium, Inderal, Ropinirole, Micardis.    PROCEDURE:  This is a multichannel digital polysomnogram utilizing the SomnoStar 11.2 system.  Electrodes and sensors were applied and monitored per AASM Specifications.   EEG, EOG, Chin and Limb EMG, were sampled at 200 Hz.  ECG, Snore and Nasal Pressure, Thermal Airflow, Respiratory Effort, CPAP Flow and Pressure, Oximetry was sampled at 50 Hz. Digital video and audio were recorded.      CPAP was initiated at 8 cmH20 with heated humidity per AASM split night standards and pressure was advanced to 15 cmH20 because of hypopneas, apneas and desaturations.  The patient reached an AHI of 0.0/hr. and slept 71 minutes. The technologist changed to BiPAP for better tolerance of high PA  Pressures  At a BiPAP pressure of 16/12 cmH20, there was also reduction of the AHI to 0.0 with improvement of sleep apnea.  Nadir was 91% SpO2 and 100% sleep efficiency was reached.  Lights Out was at 20:58 and Lights On at 05:01. Total recording time (TRT) was 483.5 minutes, with a total sleep time (TST) of 416 minutes. The patient's sleep latency was 2 minutes with 0 minutes of wake time after sleep onset. REM latency was 246.5 minutes.  The sleep efficiency was 86.0%.    SLEEP ARCHITECTURE: WASO (Wake after sleep onset) was 65.5 minutes.  There were 20.5 minutes in Stage N1, 316.5 minutes Stage N2, 21.5 minutes Stage N3 and 57.5 minutes in Stage REM.  The percentage of Stage N1 was 4.9%, Stage N2 was 76.1%, Stage N3 was 5.2% and Stage R (REM sleep) was 13.8%.   RESPIRATORY ANALYSIS:  There was a total of 110 respiratory events: 0 obstructive apneas, 0 central apneas and 0 mixed apneas with a total of 0 apneas and an apnea index (AI) of 0 /hour. There were 110 hypopneas with a hypopnea index of 15.9/hour. The patient also had 0 respiratory event related arousals (RERAs).     The total APNEA/HYPOPNEA INDEX  (AHI) was 15.9 /hour and the total RESPIRATORY DISTURBANCE INDEX was 15.9 .hour  3 events occurred in REM sleep and 107 events in NREM. The REM AHI was 3.1 /hour versus a non-REM AHI of 17.9 /hour.  The patient spent 365.5 minutes of total sleep  time in the supine position and 51 minutes in non-supine. The supine AHI was 18.1, versus a non-supine AHI of 0.0.  OXYGEN SATURATION & C02:  The baseline 02 saturation was 92%, with the lowest being 82%. Time spent below 89% saturation equaled 21 minutes.  PERIODIC LIMB MOVEMENTS:    The patient had a total of 0 Periodic Limb Movements. The arousals were noted as: 40 were spontaneous, 0 were associated with PLMs, and 110 were associated with respiratory events. Audio and video analysis did not show any abnormal or unusual movements, behaviors, phonations or  vocalizations.   The patient took one bathroom break. EKG was in keeping with normal sinus rhythm (NSR).   DIAGNOSIS Complex sleep apnea responded well to CPAP at 15 cm water and to BiPAP at 16/12 cm water.  1.  Sleep quality improved, REM sleep rebounded. Post-study, the patient indicated that sleep was better than usual. The patient brought his own mask- a Mirage Fx FFM in medium.  PLANS/RECOMMENDATIONS: The patient experienced a significant reduction in AHI under both PAP modalities, I will order a new CPAP machine for him with a starting pressure ramp of 8 cm water and  Final pressure of 16 cm water, using heated humidity and FFM of his choice.   A follow up appointment will be scheduled in the Sleep Clinic at [facility].   Please call [phone] with any questions.      I certify that I have reviewed the entire raw data recording prior to the issuance of this report in accordance with the Standards of Accreditation of the American Academy of Sleep Medicine (AASM)    Larey Seat, M.D.      06-15-2017  Diplomat, American Board of Psychiatry and Neurology  Diplomat, [place] of Sleep Medicine Medical Director, [place] [facility] at [facility]

## 2017-06-18 ENCOUNTER — Telehealth: Payer: Self-pay | Admitting: Neurology

## 2017-06-18 NOTE — Telephone Encounter (Signed)
I called pt back. I advised pt that Dr. Brett Fairy reviewed their sleep study results and found that pt had complex sleep apnea. Dr. Brett Fairy recommends that pt starts CPAP since he tolerated that well in the titration study. I reviewed PAP compliance expectations with the pt. Pt is agreeable to starting a CPAP. I advised pt that an order will be sent to a DME, [facility], and [facility] will call the pt within about one week after they file with the pt's insurance. [facility] will show the pt how to use the machine, fit for masks, and troubleshoot the CPAP if needed. A follow up appt was made for insurance purposes with Cecille Rubin NP  on Sep 13 2017 at 7:45 am.  Pt verbalized understanding to arrive 15 minutes early and bring their CPAP. A letter with all of this information in it will be mailed to the pt as a reminder. I verified with the pt that the address we have on file is correct. Pt verbalized understanding of results. Pt had no questions at this time but was encouraged to call back if questions arise.

## 2017-06-18 NOTE — Telephone Encounter (Signed)
Called the pt to give sleep study results. No answers at this time.

## 2017-06-18 NOTE — Telephone Encounter (Signed)
Patient called office returning RN's call.  Please call

## 2017-09-12 ENCOUNTER — Encounter: Payer: Self-pay | Admitting: Nurse Practitioner

## 2017-09-12 NOTE — Progress Notes (Signed)
[facility]  PATIENT: Steven Pitts DOB: [birth year]   REASON FOR VISIT: Follow-up for obstructive sleep apnea with first CPAP compliance HISTORY FROM: Patient    HISTORY OF PRESENT ILLNESS:UPDATE 12/20/2018[name], 71 year old male returns for follow-up with obstructive sleep apnea here for initial CPAP compliance.  He has had no issues with his CPAP.  Compliance data dated 08/14/2017-09/12/2017 shows compliance greater than 4 hours at 100%.  Average usage 6 hours 9 minutes.  Pressure settings 8-16 cm.  EPR level 1.  AHI 5.4.  He returns for reevaluation 05/08/17 Steven Pitts is a 71 y.o. male , originally from [place] with [place] parents, seen here as in a referral/ revisit  from Dr. Philip Aspen Lynelle Doctor  colleague Dr.  Krista Blue. It was our mutual nurse practitioner, Cecille Rubin, who referred the patient to me for a CPAP reevaluation.  Steven Pitts has been a CPAP user ever since being diagnosed with sleep apnea in the year 1995, and is currently using his second CPAP machine which she owns for about 10 years. He was originally evaluated for sleep apnea because his wife was bothered by his snoring. He has been a compliant CPAP user ever since.  Steven Pitts begun experiencing tremors at age 53, there were first bilateral hand tremors, voice tremors and the tremors amplitude rose with no velocity or under stressful situations. He also developed a vocal tremor. Then ,in  2010 he began to experience gait difficulties lost his right arm swing, became unsteady and slower,  was evaluated at first  in [facility] and started on Requip, afterwards he was referred to [facility] and saw Dr. Gerald Stabs for a second opinion, was started on Sinemet and this combination therapy has been very helpful. He was evaluated for a deep brain stimulator but did not take them up on the offer. He continued to exercise regularly, he is meanwhile retired, he has by now a 8-9 year  history of Parkinson's disease, and an almost lifelong history of essential tremors.    Sleep habits are as follows: he goes to bed between 11 and 12, he falls asleep promptly, he seldomly goes to the bathroom at night and wakes spontaneously up by 5:30 AM. His bedroom is cool, quiet and dark, he sleeps  on one pillow usually on his side or on his back, he uses a mechanically adjustable bed and lifts to about 20 degrees. In spite of getting 4-5 hours of sleep at night he feels that he is not refreshed and restored in the morning. There has been no breakthrough snoring noted, he would like to sleep longer but he can't and he doesn't know why. He has tried to go to bed earlier and then just gets up earlier. He has not enacted dreams.    Sleep medical history and family sleep history:  PTSD, PD - only brother died of PD. no family history of sleep disorders at least not known, the patient had no sleep complaints in childhood and young adulthood, he was not a sleep walker, no history of night terrors no history of REM behavior disorder. He had an anterior fusion, cervical spine- left access 2002  REVIEW OF SYSTEMS: Full 14 system review of systems performed and notable only for those listed, all others are neg:  Constitutional: neg  Cardiovascular: neg Ear/Nose/Throat: neg  Skin: neg Eyes: neg Respiratory: neg Gastroitestinal: neg  Hematology/Lymphatic: neg  Endocrine: neg Musculoskeletal:neg Allergy/Immunology: neg Neurological: Tremors Psychiatric: Anxiety Sleep : Obstructive sleep  apnea with CPAP   ALLERGIES: No Known Allergies  HOME MEDICATIONS: Outpatient Medications Prior to Visit  Medication Sig Dispense Refill  . atorvastatin (LIPITOR) 20 MG tablet Take 20 mg by mouth daily.    . carbidopa-levodopa (SINEMET IR) 25-100 MG tablet Take 1 tablet by mouth 5 (five) times daily. 150 tablet 11  . DULoxetine (CYMBALTA) 60 MG capsule Take 60 mg by mouth daily.    . empagliflozin  (JARDIANCE) 25 MG TABS tablet Take 25 mg by mouth daily.    Marland Kitchen HYDROcodone-acetaminophen (NORCO) 10-325 MG per tablet Take 10-325 tablets by mouth daily.    . insulin glargine (LANTUS) 100 UNIT/ML injection Inject 30 Units into the skin daily.    . meloxicam (MOBIC) 7.5 MG tablet Take 7.5 mg by mouth 2 (two) times daily.    . metFORMIN (GLUCOPHAGE) 1000 MG tablet Take 1,000 mg by mouth daily.    Marland Kitchen NEXIUM 40 MG capsule Take 40 mg by mouth daily.    . propranolol (INDERAL) 40 MG tablet Take 40 mg by mouth daily.    . Ropinirole HCl 12 MG TB24 Take 1 tablet (12 mg total) by mouth daily. 30 tablet 12  . telmisartan-hydrochlorothiazide (MICARDIS HCT) 80-25 MG per tablet Take 80 tablets by mouth daily.     No facility-administered medications prior to visit.     PAST MEDICAL HISTORY: Past Medical History:  Diagnosis Date  . Abnormality of gait   . Anxiety   . Diabetes ([place])   . Essential and other specified forms of tremor   . High blood pressure   . Other speech disturbance(784.59)   . Parkinson's disease ([place])     PAST SURGICAL HISTORY: Past Surgical History:  Procedure Laterality Date  . cataract surgery Right   . HERNIA REPAIR    . NECK SURGERY     x4  . ROTATOR CUFF REPAIR Left     FAMILY HISTORY: Family History  Problem Relation Age of Onset  . Aneurysm Mother   . Alcohol abuse Father   . Parkinson's disease Brother     SOCIAL HISTORY: Social History   Socioeconomic History  . Marital status: Married    Spouse name: Bethena Roys  . Number of children: Not on file  . Years of education: college  . Highest education level: Not on file  Social Needs  . Financial resource strain: Not on file  . Food insecurity - worry: Not on file  . Food insecurity - inability: Not on file  . Transportation needs - medical: Not on file  . Transportation needs - non-medical: Not on file  Occupational History    Comment: [employer], Art gallery manager  Tobacco Use  . Smoking status: Former  Smoker    Types: Cigarettes  . Smokeless tobacco: Never Used  . Tobacco comment: quit 1980  Substance and Sexual Activity  . Alcohol use: No    Comment: quit in 1980  . Drug use: No  . Sexual activity: Not on file  Other Topics Concern  . Not on file  Social History Narrative   Patient is a Art gallery manager for [employer]. Patient has a college education. Patient is married to Bethena Roys) for 37 years.     PHYSICAL EXAM  Vitals:   09/13/17 0724  BP: 133/70  Pulse: 74  Weight: 222 lb (100.7 kg)  Height: _0  (1.727 m)   Body mass index is 33.75 kg/m.  Generalized: Well developed, obese male in no acute distress  Head: normocephalic and atraumatic,.  Oropharynx benign  Neck: Supple,   Musculoskeletal: No deformity   Neurological examination   Mentation: Alert oriented to time, place, history taking. Attention span and concentration appropriate. Recent and remote memory intact.  Follows all commands speech and language fluent.   Cranial nerve II-XII: Pupils were equal round reactive to light extraocular movements were full, visual field were full on confrontational test. Facial sensation and strength were normal. hearing was intact to finger rubbing bilaterally. Uvula tongue midline. head turning and shoulder shrug were normal and symmetric.Tongue protrusion into cheek strength was normal. Motor: normal bulk and tone, full strength in the BUE, BLE, fine finger movements normal, no pronator drift. No focal weakness Sensory: normal and symmetric to light touch,  Coordination: finger-nose-finger, heel-to-shin bilaterally, no dysmetria Gait and Station: Rising up from seated position without assistance, normal stance,  moderate stride, good arm swing, smooth turning, able to perform tiptoe, and heel walking without difficulty. Tandem gait is unsteady  DIAGNOSTIC DATA (LABS, IMAGING, TESTING) - I reviewed patient records, labs, notes, testing and imaging myself where  available.   ASSESSMENT AND PLAN  71 y.o. year old male  has a past medical history of Abnormality of gait, Anxiety, Diabetes ([place]), Essential and other specified forms of tremor, High blood pressure, Other speech disturbance(784.59), and Parkinson's disease ([place]).  And obstructive sleep apnea here for initial CPAP compliance.Compliance data dated 08/14/2017-09/12/2017 shows compliance greater than 4 hours at 100%.  Average usage 6 hours 9 minutes.  Pressure settings 8-16 cm.  EPR level 1.  AHI 5.4.   CPAP compliance 100% reviewed data with patient Continue same settings Follow-up yearly and as needed Dennie Bible, [facility], [facility], APRN  [facility] [address] [place], [place] [address] [phone]

## 2017-09-13 ENCOUNTER — Telehealth: Payer: Self-pay | Admitting: Neurology

## 2017-09-13 ENCOUNTER — Encounter: Payer: Self-pay | Admitting: Nurse Practitioner

## 2017-09-13 ENCOUNTER — Ambulatory Visit (INDEPENDENT_AMBULATORY_CARE_PROVIDER_SITE_OTHER): Payer: Medicare Other | Admitting: Nurse Practitioner

## 2017-09-13 VITALS — BP 133/70 | HR 74 | Ht 68.0 in | Wt 222.0 lb

## 2017-09-13 DIAGNOSIS — Z9989 Dependence on other enabling machines and devices: Secondary | ICD-10-CM | POA: Diagnosis not present

## 2017-09-13 DIAGNOSIS — G4733 Obstructive sleep apnea (adult) (pediatric): Secondary | ICD-10-CM

## 2017-09-13 NOTE — Telephone Encounter (Signed)
At apt today patient has requested that Dr Dohmeier take over his care for parkinson's along with his sleep apnea. I have discussed this with both Dr Krista Blue and Dr Dohmeier and they are both ok with this decision.

## 2017-09-13 NOTE — Progress Notes (Signed)
I agree with the assessment and plan as directed by NP .The patient is known to me .   Kymberly Blomberg, MD  

## 2017-09-13 NOTE — Patient Instructions (Signed)
CPAP compliance 100% Continue same settings Follow-up yearly and as needed

## 2017-10-29 ENCOUNTER — Encounter: Payer: Self-pay | Admitting: Nurse Practitioner

## 2017-12-26 ENCOUNTER — Ambulatory Visit (INDEPENDENT_AMBULATORY_CARE_PROVIDER_SITE_OTHER): Payer: Medicare Other | Admitting: Podiatry

## 2017-12-26 ENCOUNTER — Encounter: Payer: Self-pay | Admitting: Podiatry

## 2017-12-26 VITALS — BP 144/67 | HR 58 | Ht 68.0 in | Wt 200.0 lb

## 2017-12-26 DIAGNOSIS — L600 Ingrowing nail: Secondary | ICD-10-CM | POA: Diagnosis not present

## 2017-12-26 DIAGNOSIS — L03032 Cellulitis of left toe: Secondary | ICD-10-CM | POA: Diagnosis not present

## 2017-12-26 NOTE — Progress Notes (Signed)
SUBJECTIVE: 72 y.o. year old male presents complaining of left big toe pain x 2 weeks. Has a blister on the 3rd toe that does not bother him. Been on it for a year.  Review of Systems  Constitutional: Negative.   HENT: Negative.   Eyes: Negative.   Respiratory: Negative.   Cardiovascular: Negative.   Gastrointestinal:       GERD.  Genitourinary: Negative.   Musculoskeletal:       Neck, shoulder, and lower back.  Skin: Negative.   Neurological: Negative.   Endo/Heme/Allergies: Negative.      OBJECTIVE: DERMATOLOGIC EXAMINATION: Deformed ingrown nail with inflamed ungual labia left great toe lateral border. 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  VASCULAR EXAMINATION OF LOWER LIMBS: All pedal pulses are palpable with normal pulsation.  No active drainage noted.  NEUROLOGIC EXAMINATION OF THE LOWER LIMBS: All epicritic and tactile sensations grossly intact. Sharp and Dull discriminatory sensations at the plantar ball of hallux is intact bilateral.   MUSCULOSKELETAL EXAMINATION: No gross deformities.  ASSESSMENT: Deformed nail with inflamed ungual labia left great toe lateral border. 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  PLAN: Reviewed findings and available treatment options. Offending border was removed. Area cleansed with Iodine and compression dressing applied with soaking instruction. If the condition continues will require permanent procedure with excision of glomus tumor.

## 2017-12-26 NOTE — Patient Instructions (Signed)
Seen for ingrown nail. Offending border debrided. Return in 3 months or sooner if needed.

## 2018-01-23 ENCOUNTER — Encounter: Payer: Self-pay | Admitting: Podiatry

## 2018-01-23 ENCOUNTER — Ambulatory Visit (INDEPENDENT_AMBULATORY_CARE_PROVIDER_SITE_OTHER): Payer: Medicare Other | Admitting: Podiatry

## 2018-01-23 DIAGNOSIS — L03031 Cellulitis of right toe: Secondary | ICD-10-CM | POA: Diagnosis not present

## 2018-01-23 DIAGNOSIS — M7989 Other specified soft tissue disorders: Secondary | ICD-10-CM

## 2018-01-23 DIAGNOSIS — M79674 Pain in right toe(s): Secondary | ICD-10-CM

## 2018-01-23 NOTE — Progress Notes (Signed)
Subjective: 72 y.o. year old male patient presents complaining of painful draining right big toe nail.  Patient has had left big toe nail problem with a growth in nail bed that has been resolved.  Objective: Dermatologic: Thick yellow nail with drainage from under nail bed right great toe. Deformed left great toe nail plate is dry. Vascular: Pedal pulses are all palpable. Orthopedic: No gross deformities. Neurologic: All epicritic and tactile sensations grossly intact.  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  Treatment: Reviewed findings and available treatment options. Right great toe nail removed and cleansed with Iodine. Compression bandage applied with instruction to keep it on for 2 days and follow by daily Epson salt water soak. Return if condition fail to improve.

## 2018-01-23 NOTE — Patient Instructions (Signed)
Seen for draining right great toe nail. Noted of a growth originated from capillary ending under nail plate. Nail debrided. Wound cleansed and Betadine compression bandage applied. Keep the dressing x 2 days. May follow with daily Epson salt water soak. Return if condition fail to improve.

## 2018-03-27 ENCOUNTER — Encounter: Payer: Self-pay | Admitting: Podiatry

## 2018-03-27 ENCOUNTER — Ambulatory Visit (INDEPENDENT_AMBULATORY_CARE_PROVIDER_SITE_OTHER): Payer: Medicare Other | Admitting: Podiatry

## 2018-03-27 DIAGNOSIS — M79672 Pain in left foot: Secondary | ICD-10-CM | POA: Diagnosis not present

## 2018-03-27 DIAGNOSIS — L600 Ingrowing nail: Secondary | ICD-10-CM

## 2018-03-27 DIAGNOSIS — M79671 Pain in right foot: Secondary | ICD-10-CM

## 2018-03-27 DIAGNOSIS — B351 Tinea unguium: Secondary | ICD-10-CM | POA: Diagnosis not present

## 2018-03-27 NOTE — Progress Notes (Signed)
Subjective: 72 y.o. year old male patient presents complaining of painful nails. Patient requests toe nails trimmed.   Objective: Dermatologic: Thick yellow deformed nails x 10. Ingrown hallucal nails without infection bilateral. Vascular: Pedal pulses are all palpable. Orthopedic: No gross deformities. Neurologic: All epicritic and tactile sensations grossly intact.  Assessment: Dystrophic mycotic nails x 10. Ingrown hallucal nails. Pain in both feet.  Treatment: All mycotic nails debrided.  Return in 3 months or as needed.

## 2018-03-27 NOTE — Patient Instructions (Signed)
Seen for hypertrophic nails. All nails debrided. Return in 3 months or as needed.  

## 2018-04-08 ENCOUNTER — Other Ambulatory Visit: Payer: Self-pay | Admitting: Nurse Practitioner

## 2018-04-09 ENCOUNTER — Ambulatory Visit: Payer: Self-pay | Admitting: Neurology

## 2018-04-20 ENCOUNTER — Other Ambulatory Visit: Payer: Self-pay | Admitting: Nurse Practitioner

## 2018-05-15 ENCOUNTER — Other Ambulatory Visit: Payer: Self-pay | Admitting: Internal Medicine

## 2018-05-15 DIAGNOSIS — M545 Low back pain: Secondary | ICD-10-CM

## 2018-05-24 ENCOUNTER — Other Ambulatory Visit: Payer: Self-pay

## 2018-05-25 ENCOUNTER — Ambulatory Visit
Admission: RE | Admit: 2018-05-25 | Discharge: 2018-05-25 | Disposition: A | Discharge status: Home / Self Care | Payer: Medicare Other | Source: Ambulatory Visit | Attending: Internal Medicine | Admitting: Internal Medicine

## 2018-05-25 DIAGNOSIS — M545 Low back pain: Secondary | ICD-10-CM

## 2018-05-25 MED ORDER — GADOBENATE DIMEGLUMINE 529 MG/ML IV SOLN
19.0000 mL | Freq: Once | INTRAVENOUS | Status: AC | PRN
  Administered 2018-05-25: 19 mL via INTRAVENOUS

## 2018-07-04 ENCOUNTER — Ambulatory Visit: Payer: Self-pay | Admitting: Podiatry

## 2018-07-11 ENCOUNTER — Encounter: Payer: Self-pay | Admitting: Podiatry

## 2018-07-11 ENCOUNTER — Ambulatory Visit (INDEPENDENT_AMBULATORY_CARE_PROVIDER_SITE_OTHER): Payer: Medicare Other | Admitting: Podiatry

## 2018-07-11 DIAGNOSIS — M79671 Pain in right foot: Secondary | ICD-10-CM

## 2018-07-11 DIAGNOSIS — L600 Ingrowing nail: Secondary | ICD-10-CM | POA: Diagnosis not present

## 2018-07-11 DIAGNOSIS — M79672 Pain in left foot: Secondary | ICD-10-CM | POA: Diagnosis not present

## 2018-07-11 DIAGNOSIS — B351 Tinea unguium: Secondary | ICD-10-CM | POA: Diagnosis not present

## 2018-07-11 NOTE — Patient Instructions (Signed)
Seen for hypertrophic nails. All nails debrided. Return as needed.

## 2018-07-11 NOTE — Progress Notes (Signed)
Subjective: 72 y.o. year old male patient presents complaining of painful nails. Patient requests toe nails trimmed.   Objective: Dermatologic: Thick yellow deformed nails x 10. Ingrown hallucal nails left great toe. Vascular: Pedal pulses are all palpable. Orthopedic: Contracted lesser digits  Neurologic: All epicritic and tactile sensations grossly intact.  Assessment: Dystrophic mycotic nails x 10. Ingrown left great toe nail.  Treatment: All mycotic nails debrided.  Bleeding left great toe nail medial border cleansed with Iodine and dressing applied. Return as needed.

## 2018-09-11 ENCOUNTER — Encounter: Payer: Self-pay | Admitting: Nurse Practitioner

## 2018-09-11 NOTE — Progress Notes (Signed)
[facility]  PATIENT: Steven Pitts DOB: 20-Aug-1946   REASON FOR VISIT: Follow-up for obstructive sleep apnea with  CPAP compliance HISTORY FROM: Patient    HISTORY OF PRESENT ILLNESS:UPDATE 12/20/2019CM Mr. Schum, 72 year old male returns for follow-up with history of obstructive sleep apnea here for CPAP compliance.  He currently has a pinging noise in his machine and he is due to take it [facility]  to be evaluated.  Compliance data dated 08/13/2018-09/11/2018 shows compliance greater than 4 hours at 100%.  Average usage 5 hours 55 minutes.  Set pressure 8 to 16 cm.  EPR level 1 95th percentile 81.1 AHI 4.5.  ESS 2.  Patient is not aware that he has a leak he has a full beard.  Patient also has a history of Parkinson's disease and essential tremor.  He has had 2 falls in the last year.  He is currently on Sinemet and Requip which.  He denies any side effects to his medications his symptoms are well controlled.  He exercises on a stationary bike.  He denies any weakness freezing spells difficulty swallowing.  He returns for reevaluation   UPDATE 12/20/2018CM Mr. Kondracki, 72 year old male returns for follow-up with obstructive sleep apnea here for initial CPAP compliance.  He has had no issues with his CPAP.  Compliance data dated 08/14/2017-09/12/2017 shows compliance greater than 4 hours at 100%.  Average usage 6 hours 9 minutes.  Pressure settings 8-16 cm.  EPR level 1.  AHI 5.4.  He returns for reevaluation 05/08/17 CDWilliam A Formica is a 72 y.o. male , originally from [place] with [place] parents, seen here as in a referral/ revisit  from Dr. Philip Aspen Lynelle Doctor  colleague Dr.  Krista Blue. It was our mutual nurse practitioner, Cecille Rubin, who referred the patient to me for a CPAP reevaluation.  Mr. Kishi has been a CPAP user ever since being diagnosed with sleep apnea in the year 1995, and is currently using his second CPAP machine which she owns for about 10 years. He  was originally evaluated for sleep apnea because his wife was bothered by his snoring. He has been a compliant CPAP user ever since.  Mr. Kresse begun experiencing tremors at age 39, there were first bilateral hand tremors, voice tremors and the tremors amplitude rose with no velocity or under stressful situations. He also developed a vocal tremor. Then ,in  2010 he began to experience gait difficulties lost his right arm swing, became unsteady and slower,  was evaluated at first  in [facility] and started on Requip, afterwards he was referred to [facility] and saw Dr. Gerald Stabs for a second opinion, was started on Sinemet and this combination therapy has been very helpful. He was evaluated for a deep brain stimulator but did not take them up on the offer. He continued to exercise regularly, he is meanwhile retired, he has by now a 8-9 year history of Parkinson's disease, and an almost lifelong history of essential tremors.    Sleep habits are as follows: he goes to bed between 11 and 12, he falls asleep promptly, he seldomly goes to the bathroom at night and wakes spontaneously up by 5:30 AM. His bedroom is cool, quiet and dark, he sleeps  on one pillow usually on his side or on his back, he uses a mechanically adjustable bed and lifts to about 20 degrees. In spite of getting 4-5 hours of sleep at night he feels that he is not refreshed and restored in the  morning. There has been no breakthrough snoring noted, he would like to sleep longer but he can't and he doesn't know why. He has tried to go to bed earlier and then just gets up earlier. He has not enacted dreams.  UPDATE 07/13/2018CM Mr. Chauvin, 72 year old male returns for follow-up with a nine-year history of Parkinson's disease. His essential tremor since the age of 15. He has had one fall in the last year no apparent injury. He continues to ambulate with a single point cane. Recent cataract surgery right and due for  left eye next  week. He remains on Sinemet 5 times a day and Requip extended release at that time. He he denies any compulsive behaviors. Continues to exercise on stationary bike. He continues to have mild problems with swallowing and double swallows when eating no freezing spells, no difficulty turning over in bed. He has a long history of obstructive sleep apnea and complains of more daytime drowsiness and fatigue recently. Denies nocturia. His last sleep study was in 2008 and he has an old machine. He claims he is compliant with this but would like to have a new study and an updated equipment. He returns for reevaluation  Sleep medical history and family sleep history:  PTSD, PD - only brother died of PD. no family history of sleep disorders at least not known, the patient had no sleep complaints in childhood and young adulthood, he was not a sleep walker, no history of night terrors no history of REM behavior disorder. He had an anterior fusion, cervical spine- left access 2002  REVIEW OF SYSTEMS: Full 14 system review of systems performed and notable only for those listed, all others are neg:  Constitutional: neg  Cardiovascular: neg Ear/Nose/Throat: neg  Skin: neg Eyes: neg Respiratory: neg Gastroitestinal: neg  Hematology/Lymphatic: neg  Endocrine: neg Musculoskeletal:neg Allergy/Immunology: neg Neurological: Tremors Psychiatric: Anxiety Sleep : Obstructive sleep apnea with CPAP   ALLERGIES: Allergies  Allergen Reactions  . Gadolinium Derivatives Other (See Comments)    Patient sneezed immediately after administering gadolinium/ no treatment/ pt had no other reaction//jv    HOME MEDICATIONS: Outpatient Medications Prior to Visit  Medication Sig Dispense Refill  . atorvastatin (LIPITOR) 20 MG tablet Take 20 mg by mouth daily.    . carbidopa-levodopa (SINEMET IR) 25-100 MG tablet TAKE 1 TABLET BY MOUTH FIVE TIMES DAILY 150 tablet 11  . DULoxetine (CYMBALTA) 60 MG capsule Take 60 mg by mouth  daily.    . empagliflozin (JARDIANCE) 25 MG TABS tablet Take 25 mg by mouth daily.    Marland Kitchen HYDROcodone-acetaminophen (NORCO) 10-325 MG per tablet Take 10-325 tablets by mouth daily.    . insulin glargine (LANTUS) 100 UNIT/ML injection Inject 30 Units into the skin daily.    . meloxicam (MOBIC) 7.5 MG tablet Take 7.5 mg by mouth 2 (two) times daily.    . metFORMIN (GLUCOPHAGE) 1000 MG tablet Take 1,000 mg by mouth daily.    Marland Kitchen NEXIUM 40 MG capsule Take 40 mg by mouth daily.    . propranolol (INDERAL) 40 MG tablet Take 40 mg by mouth daily.    . Ropinirole HCl 12 MG TB24 TAKE ONE (1) TABLET BY MOUTH EVERY DAY 30 each 5  . telmisartan-hydrochlorothiazide (MICARDIS HCT) 80-25 MG per tablet Take 80 tablets by mouth daily.     No facility-administered medications prior to visit.     PAST MEDICAL HISTORY: Past Medical History:  Diagnosis Date  . Abnormality of gait   . Anxiety   .  Diabetes ([place])   . Essential and other specified forms of tremor   . High blood pressure   . Other speech disturbance(784.59)   . Parkinson's disease ([place])     PAST SURGICAL HISTORY: Past Surgical History:  Procedure Laterality Date  . cataract surgery Right   . HERNIA REPAIR    . NECK SURGERY     x4  . ROTATOR CUFF REPAIR Left     FAMILY HISTORY: Family History  Problem Relation Age of Onset  . Aneurysm Mother   . Alcohol abuse Father   . Parkinson's disease Brother     SOCIAL HISTORY: Social History   Socioeconomic History  . Marital status: Married    Spouse name: Bethena Roys  . Number of children: Not on file  . Years of education: college  . Highest education level: Not on file  Occupational History    Comment: [employer], Art gallery manager  Social Needs  . Financial resource strain: Not on file  . Food insecurity:    Worry: Not on file    Inability: Not on file  . Transportation needs:    Medical: Not on file    Non-medical: Not on file  Tobacco Use  . Smoking status: Former Smoker    Types:  Cigarettes  . Smokeless tobacco: Never Used  . Tobacco comment: quit 1980  Substance and Sexual Activity  . Alcohol use: No    Comment: quit in 1980  . Drug use: No  . Sexual activity: Not on file  Lifestyle  . Physical activity:    Days per week: Not on file    Minutes per session: Not on file  . Stress: Not on file  Relationships  . Social connections:    Talks on phone: Not on file    Gets together: Not on file    Attends religious service: Not on file    Active member of club or organization: Not on file    Attends meetings of clubs or organizations: Not on file    Relationship status: Not on file  . Intimate partner violence:    Fear of current or ex partner: Not on file    Emotionally abused: Not on file    Physically abused: Not on file    Forced sexual activity: Not on file  Other Topics Concern  . Not on file  Social History Narrative   Patient is a Art gallery manager for [employer]. Patient has a college education. Patient is married to Bethena Roys) for 37 years.     PHYSICAL EXAM  Vitals:   09/13/18 0842  BP: 118/64  Pulse: 70  Resp: 20  Weight: 213 lb 8 oz (96.8 kg)  Height: _0  (1.727 m)   Body mass index is 32.46 kg/m.  Generalized: Well developed, obese male in no acute distress well-groomed mild masking of the face  Head: normocephalic and atraumatic,. Oropharynx benign Mallopatti 4 Neck: Supple, circumference 18.5 Lungs clear Musculoskeletal: No deformity  Skin no rash or edema Neurological examination   Mentation: Alert oriented to time, place, history taking. Attention span and concentration appropriate. Recent and remote memory intact.  Follows all commands speech and language fluent.   Cranial nerve II-XII: Pupils were equal round reactive to light extraocular movements were full, visual field were full on confrontational test.  Decreased blink.  Facial sensation and strength were normal. hearing was intact to finger rubbing bilaterally. Uvula tongue  midline. head turning and shoulder shrug were normal and symmetric.Tongue protrusion into cheek strength  was normal. Motor: normal bulk and tone, full strength in the BUE, BLE, no significant rigidity or bradykinesia Sensory: normal and symmetric to light touch,  Coordination: finger-nose-finger, heel-to-shin bilaterally, no dysmetria Gait and Station: Rising up from seated position without assistance, wide-based gait  good arm swing, smooth turning, able to perform tiptoe, and heel walking without difficulty. Tandem gait is unsteady.  No assistive device  DIAGNOSTIC DATA (LABS, IMAGING, TESTING) - I reviewed patient records, labs, notes, testing and imaging myself where available.   ASSESSMENT AND PLAN  72 y.o. year old male  has a past medical history of Abnormality of gait, Anxiety, Diabetes ([place]), Essential and other specified forms of tremor, High blood pressure, Other speech disturbance(784.59), and Parkinson's disease ([place]).  And obstructive sleep apnea here for  CPAP compliance. Data dated 08/13/2018-09/11/2018 shows compliance greater than 4 hours at 100%.  Average usage 5 hours 55 minutes.  Set pressure 8 to 16 cm.  EPR level 1 95th percentile 81.1 AHI 4.5.  ESS 2..   CPAP compliance 100% reviewed data with patient Continue same settings Significant leak Needs mask refit has full beard Continue Sinemet and Requip at current dose. Follow-up yearly and as needed Dennie Bible, [facility], APRN  [facility] [address] [place], [place] [address] [phone]

## 2018-09-13 ENCOUNTER — Ambulatory Visit (INDEPENDENT_AMBULATORY_CARE_PROVIDER_SITE_OTHER): Payer: Medicare Other | Admitting: Nurse Practitioner

## 2018-09-13 ENCOUNTER — Encounter: Payer: Self-pay | Admitting: Nurse Practitioner

## 2018-09-13 ENCOUNTER — Other Ambulatory Visit: Payer: Self-pay

## 2018-09-13 VITALS — BP 118/64 | HR 70 | Resp 20 | Ht 68.0 in | Wt 213.5 lb

## 2018-09-13 DIAGNOSIS — G20 Parkinson's disease: Secondary | ICD-10-CM | POA: Diagnosis not present

## 2018-09-13 DIAGNOSIS — G4733 Obstructive sleep apnea (adult) (pediatric): Secondary | ICD-10-CM

## 2018-09-13 DIAGNOSIS — R269 Unspecified abnormalities of gait and mobility: Secondary | ICD-10-CM

## 2018-09-13 DIAGNOSIS — Z9989 Dependence on other enabling machines and devices: Secondary | ICD-10-CM

## 2018-09-13 MED ORDER — ROPINIROLE HCL ER 12 MG PO TB24: ORAL_TABLET | ORAL | 5 refills | Status: DC

## 2018-09-13 NOTE — Progress Notes (Signed)
I have read the note, and I agree with the clinical assessment and plan.  Mike Berntsen K Kerianne Gurr   

## 2018-09-13 NOTE — Patient Instructions (Signed)
CPAP compliance 100% reviewed data with patient Continue same settings Significant leak Needs mask refit Follow-up yearly and as needed

## 2018-09-13 NOTE — Progress Notes (Signed)
Community message sent via Epic: needs mask refit.

## 2019-03-21 ENCOUNTER — Other Ambulatory Visit: Payer: Self-pay | Admitting: Neurology

## 2019-04-09 ENCOUNTER — Other Ambulatory Visit: Payer: Self-pay | Admitting: Neurology

## 2019-06-18 ENCOUNTER — Other Ambulatory Visit: Payer: Self-pay | Admitting: *Deleted

## 2019-06-18 MED ORDER — ROPINIROLE HCL ER 12 MG PO TB24: ORAL_TABLET | ORAL | 1 refills | Status: DC

## 2019-09-29 ENCOUNTER — Telehealth: Payer: Self-pay | Admitting: Adult Health

## 2019-09-29 NOTE — Telephone Encounter (Signed)
I called patient and LVM to r/s or to do a MyChart visit d/t MM out. Requested patient call back to discuss.

## 2019-09-30 ENCOUNTER — Telehealth: Payer: Self-pay

## 2019-09-30 NOTE — Telephone Encounter (Signed)
Called and r/s pt appt for tomorrow, Provider ooo with Covid. Had to put with a different provider, NP Amy Lomax.

## 2019-10-01 ENCOUNTER — Telehealth: Payer: TRICARE For Life (TFL) | Admitting: Adult Health

## 2019-10-27 DEATH — deceased

## 2019-10-28 ENCOUNTER — Telehealth: Payer: Self-pay | Admitting: Nurse Practitioner

## 2019-10-28 ENCOUNTER — Telehealth (INDEPENDENT_AMBULATORY_CARE_PROVIDER_SITE_OTHER): Payer: Medicare PPO | Admitting: Family Medicine

## 2019-10-28 DIAGNOSIS — Z9989 Dependence on other enabling machines and devices: Secondary | ICD-10-CM | POA: Diagnosis not present

## 2019-10-28 DIAGNOSIS — G4733 Obstructive sleep apnea (adult) (pediatric): Secondary | ICD-10-CM

## 2019-10-28 DIAGNOSIS — G20 Parkinson's disease: Secondary | ICD-10-CM

## 2019-10-28 NOTE — Progress Notes (Signed)
PATIENT: Steven Pitts DOB: [birth year]  REASON FOR VISIT: follow up HISTORY FROM: patient  Virtual Visit via Telephone Note  I connected with Steven Pitts on 10/28/19 at 10:00 AM EST by telephone and verified that I am speaking with the correct person using two identifiers.   I discussed the limitations, risks, security and privacy concerns of performing an evaluation and management service by telephone and the availability of in person appointments. I also discussed with the patient that there may be a patient responsible charge related to this service. The patient expressed understanding and agreed to proceed.   History of Present Illness:  10/28/19 KEYANDRE Pitts is a 74 y.o. male here today for follow up. He is doing very well. He continues CPAP therapy. He reports using his machine every night. He does note a leak when he has not shaved. Otherwise, he is doing well and using therapy nightly. He continues Sinemet 25/168m five times daily. He feels that gait is stable. He uses a cane for stability. He has had 1 fall over the past year, no injuries. He feels that tremor is stable. It does wax and wane. He continues Requip 161mTB24. He is tolerating well with no obvious adverse effects. He denies changes in appetite or difficulty swallowing. He continues close follow up with PCP for HTN, HLD and DMT2. He continues Norco 10/32561maily for generalized pain.   History (copied from [employer] on 09/13/2018)  UPDATE 12/20/2019CM Mr. Steven Pitts 27ar old male returns for follow-up with history of obstructive sleep apnea here for CPAP compliance.  He currently has a pinging noise in his machine and he is due to take it [facility]  to be evaluated.  Compliance data dated 08/13/2018-09/11/2018 shows compliance greater than 4 hours at 100%.  Average usage 5 hours 55 minutes.  Set pressure 8 to 16 cm.  EPR level 1 95th percentile 81.1 AHI 4.5.  ESS 2.  Patient is not aware that  he has a leak he has a full beard.  Patient also has a history of Parkinson's disease and essential tremor.  He has had 2 falls in the last year.  He is currently on Sinemet and Requip which.  He denies any side effects to his medications his symptoms are well controlled.  He exercises on a stationary bike.  He denies any weakness freezing spells difficulty swallowing.  He returns for reevaluation  UPDATE 12/20/2018CM Mr. Pitts 74ar old male returns for follow-up with obstructive sleep apnea here for initial CPAP compliance.  He has had no issues with his CPAP.  Compliance data dated 08/14/2017-09/12/2017 shows compliance greater than 4 hours at 100%.  Average usage 6 hours 9 minutes.  Pressure settings 8-16 cm.  EPR level 1.  AHI 5.4.  He returns for reevaluation  05/08/17 CDWilliam A Johnsonis a [age]o.male, [place]seen here as in a referral/ revisit from Dr. PatJethro Bastos. YanKrista Pitts was our mutual nurse practitioner, CarCecille Pitts referred the patient to me for a CPAP reevaluation.  [name] been a CPAP user ever since being diagnosed with sleep apnea in the year 1995,and is currently using his second CPAP machine which she owns for about 10 years. He was originally evaluated for sleep apnea because his wife was bothered by his snoring. He has been a compliant CPAP user ever since.  Mr. JohQuintanargun experiencing tremors at age [age]here were first bilateral hand tremors, voice tremors and the tremors  amplitude rose with no velocity or under stressful situations. He also developed a vocal tremor. Then,in2010 he began to experience gait difficulties lost his right arm swing, became unsteady and slower,was evaluated at [facility] and started on Requip, afterwards he was referred to [facility] and saw Dr. Gerald Stabs for a second opinion, was started on Sinemet and this combination therapy has been very  helpful. He was evaluated for a deep brain stimulator but did not take them upon the offer. He continued to exercise regularly, he is meanwhile retired, he has by now a 8-9 year history of Parkinson's disease, and an almost lifelong history of essential tremors.  Sleep habits are as follows:he goes to bed between 11 and 12,he falls asleep promptly, he seldomly goes to the bathroom at night and wakes spontaneously up by 5:30 AM. His bedroom is cool, quiet and dark, he sleeps on one pillow usually on his side or on his back,he uses a mechanically adjustable bed and lifts to about 20 degrees. In spite of getting 4-5 hours of sleep at night he feels that he is not refreshed and restored in the morning. There has been no breakthrough snoring noted, he would like to sleep longer but he can't and he doesn't know why. He has tried to go to bed earlier and then just gets up earlier.He has not enacted dreams.  UPDATE07/13/2018CMMr. Pitts, 74 year old male returns for follow-up with a nine-year history of Parkinson's disease. His essential tremor since the age of 24. He has had one fall in the last year no apparent injury. He continues to ambulate with a single point cane. Recent cataract surgery rightanddueforleft eye next week. He remains on Sinemet 5 times a day and Requip extended release at that time. He he denies any compulsive behaviors.Continues to exercise on stationary bike. He continues to have mild problems with swallowing and double swallows when eating no freezing spells, no difficulty turning over in bed. He has a long history of obstructive sleep apnea and complains of more daytime drowsiness and fatigue recently. Denies nocturia.His last sleep study was in 2008 and he has an old machine. He claims he is compliant with this but would like to have a new study and an updated equipment. He returns for reevaluation  Sleep medical history and family sleep history:PTSD, PD - only  brother died of PD. nofamily history of sleep disorders at least not known, the patient had no sleep complaints in childhood and young adulthood, he was not a sleep walker, no history of night terrors no history of REM behavior disorder.He had an anterior fusion, cervical spine- left access 2002    Observations/Objective:  Generalized: Well developed, in no acute distress  Mentation: Alert oriented to time, place, history taking. Follows all commands speech and language fluent Motor: moves extremities freely.    Assessment and Plan:  74 y.o. year old male  has a past medical history of Abnormality of gait, Anxiety, Diabetes ([place]), Essential and other specified forms of tremor, High blood pressure, Other speech disturbance(784.59), and Parkinson's disease ([place]). here with    ICD-10-CM   1. Parkinson's disease ([place])  G20   2. Obstructive sleep apnea on CPAP  G47.33    Z99.89     Davone is doing very well on CPAP therapy. Download unavailable for review with today's Mychart visit. He will take his CPAP machine to [facility] [place] for card download. Once received, we will update notes with compliance information. He was encouraged to  continue CPAP nightly and greater than 4 hours each night. We will continue Sinemet 5 times daily and Requip daily as prescribed. He will continue to monitor gait and use cane for stability. Fall precautions given. He will follow up closely with PCP as directed. Adequate hydration, well balanced diet and regular exercise advised. He will follow up with [place] in 1 year, sooner if needed. He verbalizes understanding and agreement with this plan.   No orders of the defined types were placed in this encounter.   No orders of the defined types were placed in this encounter.    Follow Up Instructions:  I discussed the assessment and treatment plan with the patient. The patient was provided an opportunity to ask questions and all were answered. The patient agreed  with the plan and demonstrated an understanding of the instructions.   The patient was advised to call back or seek an in-person evaluation if the symptoms worsen or if the condition fails to improve as anticipated.  I provided 20 minutes of non-face-to-face time during this encounter. Patient is located at his place of residence during [place] visit. Provider is in the office.    Debbora Presto, NP

## 2019-10-28 NOTE — Telephone Encounter (Signed)
Pt called stating he wanted Amy,  NP to know that his CPAP did not come with a SD card, please call

## 2019-10-28 NOTE — Telephone Encounter (Signed)
Download placed in your inbox stand.

## 2019-10-30 ENCOUNTER — Encounter: Payer: Self-pay | Admitting: Family Medicine

## 2019-11-03 ENCOUNTER — Ambulatory Visit: Payer: Medicare PPO | Attending: Internal Medicine

## 2019-11-03 DIAGNOSIS — Z23 Encounter for immunization: Secondary | ICD-10-CM | POA: Insufficient documentation

## 2019-11-03 NOTE — Progress Notes (Signed)
   Covid-19 Vaccination Clinic  Name:  Steven Pitts    MRN: [id number] DOB: 03-07-1946  11/03/2019  [name] was observed post Covid-19 immunization for 15 minutes without incidence. He was provided with Vaccine Information Sheet and instruction to access the V-Safe system.   [name] was instructed to call 911 with any severe reactions post vaccine: Marland Kitchen Difficulty breathing  . Swelling of your face and throat  . A fast heartbeat  . A bad rash all over your body  . Dizziness and weakness    Immunizations Administered    Name Date Dose VIS Date Route   Pfizer COVID-19 Vaccine 11/03/2019 10:33 AM 0.3 mL 09/05/2019 Intramuscular   Manufacturer: [place]   Lot: [id number]   [place]: [id number]

## 2019-11-22 ENCOUNTER — Ambulatory Visit: Payer: Medicare Other

## 2019-11-26 ENCOUNTER — Ambulatory Visit: Payer: Medicare PPO | Attending: Internal Medicine

## 2019-11-26 DIAGNOSIS — Z23 Encounter for immunization: Secondary | ICD-10-CM | POA: Insufficient documentation

## 2019-11-26 NOTE — Progress Notes (Signed)
   Covid-19 Vaccination Clinic  Name:  Steven Pitts    MRN: [id number] DOB: April 24, 1946  11/26/2019  [name] was observed post Covid-19 immunization for 15 minutes without incident. He was provided with Vaccine Information Sheet and instruction to access the V-Safe system.   [name] was instructed to call 911 with any severe reactions post vaccine: Marland Kitchen Difficulty breathing  . Swelling of face and throat  . A fast heartbeat  . A bad rash all over body  . Dizziness and weakness   Immunizations Administered    Name Date Dose VIS Date Route   Pfizer COVID-19 Vaccine 11/26/2019 10:56 AM 0.3 mL 09/05/2019 Intramuscular   Manufacturer: [place]   Lot: [id number]   [place]: [address]-5

## 2019-12-18 ENCOUNTER — Ambulatory Visit: Payer: Medicare PPO | Admitting: Cardiology

## 2019-12-18 ENCOUNTER — Other Ambulatory Visit: Payer: Self-pay

## 2019-12-18 ENCOUNTER — Encounter: Payer: Self-pay | Admitting: Cardiology

## 2019-12-18 VITALS — BP 141/74 | HR 82 | Temp 97.7°F | Ht 68.0 in | Wt 212.8 lb

## 2019-12-18 DIAGNOSIS — J441 Chronic obstructive pulmonary disease with (acute) exacerbation: Secondary | ICD-10-CM

## 2019-12-18 DIAGNOSIS — I5031 Acute diastolic (congestive) heart failure: Secondary | ICD-10-CM

## 2019-12-18 DIAGNOSIS — I10 Essential (primary) hypertension: Secondary | ICD-10-CM

## 2019-12-18 DIAGNOSIS — Z87891 Personal history of nicotine dependence: Secondary | ICD-10-CM

## 2019-12-18 DIAGNOSIS — R0609 Other forms of dyspnea: Secondary | ICD-10-CM

## 2019-12-18 DIAGNOSIS — R0989 Other specified symptoms and signs involving the circulatory and respiratory systems: Secondary | ICD-10-CM

## 2019-12-18 MED ORDER — FUROSEMIDE 20 MG PO TABS: 20.0000 mg | ORAL_TABLET | Freq: Every day | ORAL | 2 refills | Status: DC

## 2019-12-18 NOTE — Patient Instructions (Signed)

## 2019-12-18 NOTE — Progress Notes (Signed)
Primary Physician/Referring:  Leanna Battles, MD  Patient ID: Steven Pitts, male    DOB: August 26, 1946, 74 y.o.   MRN: [id number]  Chief Complaint  Patient presents with  . Congestive Heart Failure  . Shortness of Breath  . Hypertension  . New Patient (Initial Visit)   HPI:    Steven Pitts  is a 74 y.o. Caucasian male with tobacco use disorder, hypertension, diabetes mellitus, hyperlipidemia, obstructive sleep apnea on CPAP and restless leg syndrome referred to me for evaluation of worsening dyspnea and for evaluation of congestive heart failure.  Patient was seen by his PCP on 12/15/2019 with chest x-ray revealing bilateral pneumonia, patient refused to go to the emergency room and was started on Levaquin and also lasix.  Stat consult to cardiology was also made due to hypotension, dyspnea and elevated BNP.   Since being on furosemide, he has felt markedly improved and had excellent diuresis and dyspnea improved.  He has chronic leg edema that is persistent and he has not noticed any worsening leg edema during the episode.  He had orthopnea during the episode.  Past Medical History:  Diagnosis Date  . Abnormality of gait   . Anxiety   . Diabetes ([place])   . Essential and other specified forms of tremor   . High blood pressure   . Other speech disturbance(784.59)   . Parkinson's disease [facility])    Past Surgical History:  Procedure Laterality Date  . cataract surgery Right   . HERNIA REPAIR    . NECK SURGERY     x4  . ROTATOR CUFF REPAIR Left    Family History  Problem Relation Age of Onset  . Aneurysm Mother   . Alcohol abuse Father   . Parkinson's disease Brother     Social History   Tobacco Use  . Smoking status: Former Smoker    Types: Cigarettes    Quit date: 12/17/1988    Years since quitting: 31.0  . Smokeless tobacco: Never Used  . Tobacco comment: quit 1980  Substance Use Topics  . Alcohol use: No    Comment: quit in 1980   ROS  Review of Systems    Cardiovascular: Positive for dyspnea on exertion and leg swelling (chronic). Negative for chest pain.  Musculoskeletal: Positive for back pain and joint pain.  Gastrointestinal: Negative for melena.   Objective  Blood pressure (!) 141/74, pulse 82, temperature 97.7 F (36.5 C), height _0  (1.727 m), weight 212 lb 12.8 oz (96.5 kg), SpO2 91 %.  Vitals with BMI 12/18/2019 09/13/2018 12/26/2017  Height _1  _2  _3   Weight 212 lbs 13 oz 213 lbs 8 oz 200 lbs  BMI 32.36 89.78 47.84  Systolic 128 208 138  Diastolic 74 64 67  Pulse 82 70 58     Physical Exam  Cardiovascular: Normal rate, regular rhythm, normal heart sounds, intact distal pulses and normal pulses. Exam reveals no gallop.  No murmur heard. Pulses:      Carotid pulses are on the left side with bruit. 2+ leg edema and 1-2+ leg edema, pitting.  Chronic venous stasis changes noted.  Right lower extremity mild cellulitis changes are also evident.  Old healed venous ulcerations with scar noted. No JVD. Except for right PT which is faint, lower extremity pulses are normal.  Left carotid bruit present.  Pulmonary/Chest: Effort normal and breath sounds normal.  Abdominal: Soft. Bowel sounds are normal.   Laboratory examination:   External labs:  Labs 12/15/2019: COVID-19 rapid test negative  Hb 11.9/HCT 41.7, WBC 13.0, platelets 317.  BNP 288.  Cholesterol, total 109.000 m 05/13/2019 HDL 25 MG/DL 05/13/2019 LDL 68.000 mg 05/13/2019 Triglycerides 81.000 05/13/2019  A1C 7.500 % 05/13/2019  Creatinine, Serum 0.900 mg/ 05/13/2019  Medications and allergies   Allergies  Allergen Reactions  . Gadolinium Derivatives Other (See Comments)    Patient sneezed immediately after administering gadolinium/ no treatment/ pt had no other reaction//jv     Current Outpatient Medications  Medication Instructions  . atorvastatin (LIPITOR) 20 mg, Daily  . carbidopa-levodopa (SINEMET IR) 25-100 MG tablet TAKE 1 TABLET BY MOUTH 5  TIMES PER DAY  . DULoxetine (CYMBALTA) 60 mg, Daily  . empagliflozin (JARDIANCE) 25 mg, Oral, Daily  . furosemide (LASIX) 20 mg, Oral, Daily  . HYDROcodone-acetaminophen (NORCO) 10-325 MG per tablet 10-325 tablets, Daily  . insulin glargine (LANTUS) 30 Units, Subcutaneous, Daily  . metFORMIN (GLUCOPHAGE) 1,000 mg, Daily  . NexIUM 40 mg, Daily  . PROAIR HFA 108 (90 Base) MCG/ACT inhaler 2 puffs, Inhalation, Every 4 hours PRN  . propranolol (INDERAL) 40 mg, Oral, 2 times daily  . Ropinirole HCl 12 MG TB24 TAKE ONE (1) TABLET BY MOUTH EACH DAY  . telmisartan-hydrochlorothiazide (MICARDIS HCT) 80-25 MG per tablet 1 tablet, Oral, Daily   Radiology:   Chest x-ray PA and lateral view 12/11/2019: Diffuse bilateral lung opacities of uncertain etiology, may represent acute airspace disease, chronic parenchymal lung changes are a combination of each.  Compared to 2017, the chest previously was hyperexpanded but clear suggestive of emphysema.  Cardiac Studies:   None  EKG  EKG 12/18/2019: Normal sinus rhythm at rate of 81 bpm, borderline criteria for left enlargement.  Nonspecific abnormality.  PACs (2)    Assessment     ICD-10-CM   1. Acute diastolic heart failure (HCC)  I50.31 furosemide (LASIX) 20 MG tablet    DG Chest 2 View    PCV MYOCARDIAL PERFUSION WITH LEXISCAN    PCV ECHOCARDIOGRAM COMPLETE    Basic metabolic panel    Brain natriuretic peptide    Brain natriuretic peptide    Basic metabolic panel  2. Dyspnea on exertion  R06.00 DG Chest 2 View    PCV MYOCARDIAL PERFUSION WITH LEXISCAN  3. Essential hypertension  I10 EKG 12-Lead  4. History of tobacco use 50 pack year history Quit 1990  Z87.891   5. Chronic obstructive pulmonary disease with acute exacerbation (HCC)  J44.1 DG Chest 2 View  6. Left carotid bruit  R09.89 PCV CAROTID DUPLEX (BILATERAL)     Meds ordered this encounter  Medications  . furosemide (LASIX) 20 MG tablet    Sig: Take 1 tablet (20 mg total) by mouth  daily.    Dispense:  30 tablet    Refill:  2    Medications Discontinued During This Encounter  Medication Reason  . meloxicam (MOBIC) 7.5 MG tablet Error  . furosemide (LASIX) 20 MG tablet Reorder    Recommendations:   JODIE LEINER  is a 74 y.o. Caucasian male with tobacco use disorder, hypertension, diabetes mellitus, hyperlipidemia, obstructive sleep apnea on CPAP and restless leg syndrome referred to me for evaluation of worsening dyspnea and for evaluation of congestive heart failure.   Patient presentation with acute decompensated heart failure is very consistent with acute diastolic heart failure.  I would also like to exclude any possibility of pulmonary fibrosis or interstitial lung disease, today's lung examination was normal.  He also feels well  with diuresis with furosemide, I will repeat chest x-ray to compare to previous.  I will continue furosemide on a daily basis instead of every other day.  I will obtain a BMP and BNP in 1 week.  He is on appropriate statin therapy also beta-blocker and ARB therapy, continue the same.  Schedule for a Lexiscan Sestamibi stress test to evaluate for myocardial ischemia. Patient unable to do treadmill stress testing due to dyspnea and orthopedic issues. Will schedule for an echocardiogram. Schedule for carotid duplex for bruit/follow-up surveillance of carotid stenosis. OV in 3 weeks.   Adrian Prows, MD, [facility] 12/18/2019, 9:18 PM [facility]. [place] Office: [phone]

## 2019-12-22 ENCOUNTER — Ambulatory Visit
Admission: RE | Admit: 2019-12-22 | Discharge: 2019-12-22 | Disposition: A | Discharge status: Home / Self Care | Payer: Medicare PPO | Source: Ambulatory Visit | Attending: Cardiology | Admitting: Cardiology

## 2019-12-22 DIAGNOSIS — R0609 Other forms of dyspnea: Secondary | ICD-10-CM

## 2019-12-22 DIAGNOSIS — J441 Chronic obstructive pulmonary disease with (acute) exacerbation: Secondary | ICD-10-CM

## 2019-12-22 DIAGNOSIS — I5031 Acute diastolic (congestive) heart failure: Secondary | ICD-10-CM

## 2019-12-22 NOTE — Telephone Encounter (Signed)
Is there something we need to be doing to take of this for this patient? Let me know.

## 2019-12-23 ENCOUNTER — Telehealth: Payer: Self-pay

## 2019-12-23 NOTE — Telephone Encounter (Signed)
I was asked by the office staff to review his chest x-ray findings that was ordered in the evaluation of acute diastolic heart failure.Patient was referred to the office secondary to progressive shortness of breath which improved with Lasix administration and antibiotics.  Chest x-ray reported "Low lung volumes with areas of interstitial prominence throughout the mid to lower lungs bilaterally. This may simply reflect developing interstitial lung disease, however, the possibility of acute infection such as viral pneumonia (i.e., COVID-19 infection) should be considered with the appropriate clinical presentation. These findings could be better evaluated with follow-up high-resolution chest CT if clinically appropriate."  Please call the patient to see how he is doing clinically.  If he has shortness of breath, fevers, chills, muscle aches, etc. that is still chronic and/or progressive please have him go to the closest ER as there is concerns for possible COVID-19 infection.  If his symptoms are improving would recommend follow-up with his primary care physician ASAP who could possibly coordinate a referral to pulmonary and/or high-resolution CT scan that is recommended as per radiology.  Would recommend follow-up with Dr. Einar Gip as scheduled initially.   ST

## 2019-12-23 NOTE — Telephone Encounter (Signed)
Called patient, NA, LMAM

## 2019-12-23 NOTE — Telephone Encounter (Signed)
From pt

## 2019-12-23 NOTE — Telephone Encounter (Signed)
Represenative of [facility] called stating that patient had an xray done yesterday and she has concerns about the results and would like someone to look at it today. He is a patient of Dr. Einar Gip. Thank you.

## 2019-12-24 ENCOUNTER — Other Ambulatory Visit: Payer: Self-pay

## 2019-12-24 ENCOUNTER — Ambulatory Visit: Payer: TRICARE For Life (TFL)

## 2019-12-24 DIAGNOSIS — R0989 Other specified symptoms and signs involving the circulatory and respiratory systems: Secondary | ICD-10-CM

## 2019-12-24 DIAGNOSIS — I5031 Acute diastolic (congestive) heart failure: Secondary | ICD-10-CM

## 2019-12-24 DIAGNOSIS — I6522 Occlusion and stenosis of left carotid artery: Secondary | ICD-10-CM

## 2019-12-26 ENCOUNTER — Other Ambulatory Visit: Payer: Self-pay | Admitting: Cardiology

## 2019-12-26 DIAGNOSIS — I6522 Occlusion and stenosis of left carotid artery: Secondary | ICD-10-CM

## 2019-12-29 ENCOUNTER — Other Ambulatory Visit: Payer: Self-pay

## 2019-12-29 ENCOUNTER — Ambulatory Visit: Payer: TRICARE For Life (TFL)

## 2019-12-29 DIAGNOSIS — I5031 Acute diastolic (congestive) heart failure: Secondary | ICD-10-CM | POA: Diagnosis not present

## 2019-12-29 DIAGNOSIS — R0609 Other forms of dyspnea: Secondary | ICD-10-CM | POA: Diagnosis not present

## 2019-12-31 DIAGNOSIS — I5031 Acute diastolic (congestive) heart failure: Secondary | ICD-10-CM | POA: Diagnosis not present

## 2020-01-01 ENCOUNTER — Telehealth: Payer: Self-pay

## 2020-01-01 LAB — BASIC METABOLIC PANEL
BUN/Creatinine Ratio: 17 (ref 10–24)
BUN: 22 mg/dL (ref 8–27)
CO2: 26 mmol/L (ref 20–29)
Calcium: 10.5 mg/dL — ABNORMAL HIGH (ref 8.6–10.2)
Chloride: 96 mmol/L (ref 96–106)
Creatinine, Ser: 1.33 mg/dL — ABNORMAL HIGH (ref 0.76–1.27)
GFR calc Af Amer: 62 mL/min/{1.73_m2} (ref >59)
GFR calc non Af Amer: 53 mL/min/{1.73_m2} — ABNORMAL LOW (ref >59)
Glucose: 158 mg/dL — ABNORMAL HIGH (ref 65–99)
Potassium: 5.4 mmol/L — ABNORMAL HIGH (ref 3.5–5.2)
Sodium: 136 mmol/L (ref 134–144)

## 2020-01-01 LAB — BRAIN NATRIURETIC PEPTIDE: BNP: 14.4 pg/mL (ref 0.0–100.0)

## 2020-01-01 NOTE — Telephone Encounter (Signed)
Gave results to patient. He verbalized understanding.

## 2020-01-01 NOTE — Telephone Encounter (Signed)
-----  Message from Adrian Prows, MD sent at 01/01/2020  7:37 AM EDT ----- Decrease telmisartan HCT 80/12.5 mg to 1/2 tablet daily and recheck BMP in 1 week. Ordered. Kidney function is worsening due to lasix and potassium is high. Avoid fruits

## 2020-01-01 NOTE — Addendum Note (Signed)
Addended by: Kela Millin on: 01/01/2020 07:38 AM   Modules accepted: Orders

## 2020-01-07 DIAGNOSIS — I10 Essential (primary) hypertension: Secondary | ICD-10-CM | POA: Diagnosis not present

## 2020-01-08 DIAGNOSIS — G4733 Obstructive sleep apnea (adult) (pediatric): Secondary | ICD-10-CM | POA: Diagnosis not present

## 2020-01-08 DIAGNOSIS — G20 Parkinson's disease: Secondary | ICD-10-CM | POA: Diagnosis not present

## 2020-01-08 DIAGNOSIS — I130 Hypertensive heart and chronic kidney disease with heart failure and stage 1 through stage 4 chronic kidney disease, or unspecified chronic kidney disease: Secondary | ICD-10-CM | POA: Diagnosis not present

## 2020-01-08 DIAGNOSIS — F329 Major depressive disorder, single episode, unspecified: Secondary | ICD-10-CM | POA: Diagnosis not present

## 2020-01-08 DIAGNOSIS — R0609 Other forms of dyspnea: Secondary | ICD-10-CM | POA: Diagnosis not present

## 2020-01-08 DIAGNOSIS — Z1331 Encounter for screening for depression: Secondary | ICD-10-CM | POA: Diagnosis not present

## 2020-01-08 DIAGNOSIS — E1151 Type 2 diabetes mellitus with diabetic peripheral angiopathy without gangrene: Secondary | ICD-10-CM | POA: Diagnosis not present

## 2020-01-08 DIAGNOSIS — Z794 Long term (current) use of insulin: Secondary | ICD-10-CM | POA: Diagnosis not present

## 2020-01-08 LAB — BASIC METABOLIC PANEL
BUN/Creatinine Ratio: 17 (ref 10–24)
BUN: 19 mg/dL (ref 8–27)
CO2: 25 mmol/L (ref 20–29)
Calcium: 9.8 mg/dL (ref 8.6–10.2)
Chloride: 99 mmol/L (ref 96–106)
Creatinine, Ser: 1.15 mg/dL (ref 0.76–1.27)
GFR calc Af Amer: 74 mL/min/{1.73_m2} (ref >59)
GFR calc non Af Amer: 64 mL/min/{1.73_m2} (ref >59)
Glucose: 228 mg/dL — ABNORMAL HIGH (ref 65–99)
Potassium: 4.2 mmol/L (ref 3.5–5.2)
Sodium: 137 mmol/L (ref 134–144)

## 2020-01-15 NOTE — Progress Notes (Signed)
Primary Physician/Referring:  Leanna Battles, MD  Patient ID: Steven Pitts, male    DOB: November 27, 1945, 74 y.o.   MRN: [id number]  Chief Complaint  Patient presents with  . Congestive Heart Failure  . Follow-up    3 week   HPI:    Steven Pitts  is a 74 y.o. Caucasian male with tobacco use disorder, hypertension, diabetes mellitus, hyperlipidemia, obstructive sleep apnea on CPAP and restless leg syndrome referred to me for evaluation of worsening dyspnea and for evaluation of congestive heart failure.  Patient was seen by his PCP on 12/15/2019 with chest x-ray revealing bilateral pneumonia, patient refused to go to the emergency room and was started on Levaquin and also lasix.  Stat consult to cardiology was also made due to hypotension, dyspnea and elevated BNP.   Since being on furosemide, he has felt markedly improved and had excellent diuresis and dyspnea improved.  He has chronic leg edema that is persistent but this is also improved.  No PND or orthopnea.  States that he is almost back to his baseline but still has mild exertional dyspnea.  No chest pain or palpitations.    Past Medical History:  Diagnosis Date  . Abnormality of gait   . Anxiety   . Diabetes ([place])   . Essential and other specified forms of tremor   . High blood pressure   . Other speech disturbance(784.59)   . Parkinson's disease [facility])    Past Surgical History:  Procedure Laterality Date  . cataract surgery Right   . HERNIA REPAIR    . NECK SURGERY     x4  . ROTATOR CUFF REPAIR Left    Social History   Tobacco Use  . Smoking status: Former Smoker    Types: Cigarettes    Quit date: 12/17/1988    Years since quitting: 31.1  . Smokeless tobacco: Never Used  . Tobacco comment: quit 1980  Substance Use Topics  . Alcohol use: No    Comment: quit in 1980   Marital Status: Married  ROS  Review of Systems  Cardiovascular: Positive for dyspnea on exertion and leg swelling. Negative for chest  pain.  Musculoskeletal: Positive for back pain and joint pain.   Objective  Blood pressure 130/60, pulse 69, temperature 98 F (36.7 C), temperature source Temporal, resp. rate 16, height _0  (1.727 m), weight 210 lb 9.6 oz (95.5 kg), SpO2 95 %.  Vitals with BMI 01/16/2020 12/18/2019 09/13/2018  Height _1  _2  _3   Weight 210 lbs 10 oz 212 lbs 13 oz 213 lbs 8 oz  BMI 32.03 63.81 77.11  Systolic 657 903 833  Diastolic 60 74 64  Pulse 69 82 70     Physical Exam  Constitutional: He appears well-developed.  Mildly obese in no acute distress  Cardiovascular: Normal rate, regular rhythm, normal heart sounds, intact distal pulses and normal pulses. Exam reveals no gallop.  No murmur heard. Pulses:      Carotid pulses are on the left side with bruit. 1-2+ bilateral leg edema, pitting.  Chronic venous stasis changes noted. Old healed venous ulcerations with scar noted. No JVD. Except for right PT which is faint, lower extremity pulses are normal.  Left carotid bruit present.   Pulmonary/Chest: Effort normal. He has rales (bibasilar crackles, fine).  Abdominal: Soft. Bowel sounds are normal.   Laboratory examination:   Recent Labs    12/31/19 0826 01/07/20 0807  NA 136 137  K 5.4* 4.2  CL 96 99  CO2 26 25  GLUCOSE 158* 228*  BUN 22 19  CREATININE 1.33* 1.15  CALCIUM 10.5* 9.8  GFRNONAA 53* 64  GFRAA 62 74   estimated creatinine clearance is 66 mL/min (by C-G formula based on SCr of 1.15 mg/dL).  CMP Latest Ref Rng & Units 01/07/2020 12/31/2019 08/19/2008  Glucose 65 - 99 mg/dL 228(H) 158(H) 120(H)  BUN 8 - 27 mg/dL _0 Creatinine 0.76 - 1.27 mg/dL 1.15 1.33(H) 1.22  Sodium 134 - 144 mmol/L 137 136 143  Potassium 3.5 - 5.2 mmol/L 4.2 5.4(H) 3.6  Chloride 96 - 106 mmol/L 99 96 108  CO2 20 - 29 mmol/L _1 Calcium 8.6 - 10.2 mg/dL 9.8 10.5(H) 10.3   CBC 08/24/2008  Hemoglobin 13.8   External labs:   12/15/2019: COVID-19 rapid test negative Hb 11.9/HCT  41.7, WBC 13.0, platelets 317. BNP 288.  05/13/2019:  Cholesterol, total 109.000 HDL 25 MG/DL, LDL 68.000 mg, Triglycerides 81.000, A1C 7.500 %,  Serum Creatinine 0.900 mg  Medications and allergies   Allergies  Allergen Reactions  . Gadolinium Derivatives Other (See Comments)    Patient sneezed immediately after administering gadolinium/ no treatment/ pt had no other reaction//jv     Current Outpatient Medications  Medication Instructions  . Armodafinil 150 mg, Oral,  Every morning - 10a  . atorvastatin (LIPITOR) 20 mg, Daily  . carbidopa-levodopa (SINEMET IR) 25-100 MG tablet TAKE 1 TABLET BY MOUTH 5 TIMES PER DAY  . DULoxetine (CYMBALTA) 60 mg, Daily  . empagliflozin (JARDIANCE) 25 mg, Oral, Daily  . furosemide (LASIX) 20 mg, Oral, Daily  . HYDROcodone-acetaminophen (NORCO) 10-325 MG per tablet 10-325 tablets, Daily  . insulin glargine (LANTUS) 30 Units, Subcutaneous, Daily  . metFORMIN (GLUCOPHAGE) 1,000 mg, Daily  . NexIUM 40 mg, Daily  . propranolol (INDERAL) 40 mg, Oral, 2 times daily  . Ropinirole HCl 12 MG TB24 TAKE ONE (1) TABLET BY MOUTH EACH DAY  . telmisartan-hydrochlorothiazide (MICARDIS HCT) 80-25 MG per tablet 1 tablet, Oral, Daily    Radiology:   Chest x-ray PA and lateral view 12/11/2019: Diffuse bilateral lung opacities of uncertain etiology, may represent acute airspace disease, chronic parenchymal lung changes are a combination of each.  Compared to 2017, the chest previously was hyperexpanded but clear suggestive of emphysema.  Chest x-ray PA and lateral view 12/22/2019: 1. Low lung volumes with areas of interstitial prominence throughout the mid to lower lungs bilaterally. This may simply reflect developing interstitial lung disease, however, the possibility of acute infection such as viral pneumonia (i.e., COVID-19 infection) should be considered with the appropriate clinical presentation. These findings could be better evaluated with follow-up  high-resolution chest CT if clinically appropriate.  Cardiac Studies:   Echocardiogram 12/24/2019:  Left ventricle cavity is normal in size. Mild concentric hypertrophy of  the left ventricle. Normal global wall motion. Normal LV systolic function  with visual EF 55-60%. Doppler evidence of grade I (impaired) diastolic  dysfunction, normal LAP. Left atrial cavity is mildly dilated.  Mild (Grade I) mitral regurgitation.  Inadequate TR jet to estimate pulmonary artery systolic pressure. Normal  right atrial pressure.   Carotid artery duplex 41/58/3094:  Peak systolic velocities in the right bifurcation, internal, external and  common carotid arteries are within normal limits.  Stenosis in the left internal carotid artery (50-69%). Stenosis in the  left external carotid artery (<50%).  Antegrade right vertebral artery flow. Antegrade left vertebral artery  flow.  Follow up  in six months is appropriate if clinically indicated.  Incidental right thyroid nodule measuring approximately 1.5x1.8cm noted.  Consider dedicated thyroid scan. See image.   Lexiscan Tetrofosmin stress test 12/29/2019: No previous exam available for comparison. Lexiscan nuclear stress test performed using 1-day protocol. Stress EKG is non-diagnostic, as this is pharmacological stress test. In addition, stress EKG showed sinus tachycardia, no ST-T changes.  Decreased tracer uptake in basal inferior myocardium, seen both in rest and stress images, likely due to diaphragmatic attenuation. Stress LVEF 73%. Low risk study.   EKG:  EKG 12/18/2019: Normal sinus rhythm at rate of 81 bpm, borderline criteria for left enlargement.  Nonspecific abnormality.  PACs (2)    Assessment     ICD-10-CM   1. Chronic diastolic heart failure (HCC)  I50.32   2. Dyspnea on exertion  R06.00   3. Chronic obstructive pulmonary disease with acute exacerbation (HCC)  J44.1   4. History of tobacco use 50 pack year history Quit 1990  Z87.891    5. Left carotid stenosis  I65.22   6. Interstitial lung disease ([place])  J84.9 Ambulatory referral to Pulmonology    No orders of the defined types were placed in this encounter.   Medications Discontinued During This Encounter  Medication Reason  . PROAIR HFA 108 (90 Base) MCG/ACT inhaler Patient Preference     Recommendations:   Steven Pitts  is a 74 y.o. Caucasian male with tobacco use disorder, hypertension, diabetes mellitus, hyperlipidemia, obstructive sleep apnea on CPAP and restless leg syndrome, chronic leg edema probably due to venous insufficiency presents for evaluation of worsening dyspnea and for evaluation of congestive heart failure.  I reviewed the results of the echocardiogram and the stress test and reassured him.  Patient symptoms of dyspnea are probably related to acute diastolic heart failure but now has resolved since being on furosemide and has been careful with his diet as well.  We will continue to treat him for chronic diastolic heart failure.  Dyspnea on exertion has improved.  He does have underlying interstitial changes on chest x-ray that are persistent in spite of symptoms doing better, has fine crackles in bilateral bases, will consider evaluation for ILD, referral made to Dr. Chase Caller.  He does have left carotid stenosis of moderate to severe degree, he is presently on appropriate medical therapy with atorvastatin for lipids, lipids are at goal. Patient instructed to start ASA  57m q daily for prophylaxis.  We will continue to monitor carotid stenosis by serial surveillance duplex in 6 months.  I would like to see him back in 6 months, leg edema is improved, he also has chronic venous insufficiency which is stable and he is wearing support stockings on a regular basis.  Blood pressure is also well controlled.  JAdrian Prows MD, FGastro Surgi Center Of New Jersey4/23/2021, 1:31 PM PElk GroveCardiovascular. PBassettOffice: [phone]

## 2020-01-16 ENCOUNTER — Encounter: Payer: Self-pay | Admitting: Cardiology

## 2020-01-16 ENCOUNTER — Ambulatory Visit: Payer: Medicare PPO | Admitting: Cardiology

## 2020-01-16 ENCOUNTER — Other Ambulatory Visit: Payer: Self-pay

## 2020-01-16 VITALS — BP 130/60 | HR 69 | Temp 98.0°F | Resp 16 | Ht 68.0 in | Wt 210.6 lb

## 2020-01-16 DIAGNOSIS — I5032 Chronic diastolic (congestive) heart failure: Secondary | ICD-10-CM

## 2020-01-16 DIAGNOSIS — Z87891 Personal history of nicotine dependence: Secondary | ICD-10-CM

## 2020-01-16 DIAGNOSIS — R0989 Other specified symptoms and signs involving the circulatory and respiratory systems: Secondary | ICD-10-CM

## 2020-01-16 DIAGNOSIS — I5031 Acute diastolic (congestive) heart failure: Secondary | ICD-10-CM

## 2020-01-16 DIAGNOSIS — I6522 Occlusion and stenosis of left carotid artery: Secondary | ICD-10-CM | POA: Diagnosis not present

## 2020-01-16 DIAGNOSIS — J849 Interstitial pulmonary disease, unspecified: Secondary | ICD-10-CM | POA: Diagnosis not present

## 2020-01-16 DIAGNOSIS — I10 Essential (primary) hypertension: Secondary | ICD-10-CM

## 2020-01-16 DIAGNOSIS — R0609 Other forms of dyspnea: Secondary | ICD-10-CM | POA: Diagnosis not present

## 2020-01-16 DIAGNOSIS — J441 Chronic obstructive pulmonary disease with (acute) exacerbation: Secondary | ICD-10-CM | POA: Diagnosis not present

## 2020-01-16 MED ORDER — ASPIRIN EC 81 MG PO TBEC: 81.0000 mg | DELAYED_RELEASE_TABLET | Freq: Every day | ORAL | 3 refills | Status: AC

## 2020-01-19 ENCOUNTER — Other Ambulatory Visit: Payer: Self-pay | Admitting: *Deleted

## 2020-01-19 MED ORDER — ROPINIROLE HCL ER 12 MG PO TB24: ORAL_TABLET | ORAL | 1 refills | Status: DC

## 2020-02-19 ENCOUNTER — Ambulatory Visit (INDEPENDENT_AMBULATORY_CARE_PROVIDER_SITE_OTHER): Payer: Medicare PPO | Admitting: Internal Medicine

## 2020-02-19 ENCOUNTER — Other Ambulatory Visit: Payer: Self-pay

## 2020-02-19 ENCOUNTER — Encounter: Payer: Self-pay | Admitting: Internal Medicine

## 2020-02-19 VITALS — BP 110/74 | HR 92 | Temp 98.4°F | Ht 68.0 in | Wt 205.0 lb

## 2020-02-19 DIAGNOSIS — Z7739 Contact with and (suspected) exposure to other war theater: Secondary | ICD-10-CM

## 2020-02-19 DIAGNOSIS — R0902 Hypoxemia: Secondary | ICD-10-CM

## 2020-02-19 DIAGNOSIS — R9389 Abnormal findings on diagnostic imaging of other specified body structures: Secondary | ICD-10-CM | POA: Diagnosis not present

## 2020-02-19 DIAGNOSIS — Z77098 Contact with and (suspected) exposure to other hazardous, chiefly nonmedicinal, chemicals: Secondary | ICD-10-CM | POA: Diagnosis not present

## 2020-02-19 DIAGNOSIS — R0609 Other forms of dyspnea: Secondary | ICD-10-CM

## 2020-02-19 DIAGNOSIS — R0600 Dyspnea, unspecified: Secondary | ICD-10-CM | POA: Diagnosis not present

## 2020-02-19 DIAGNOSIS — J849 Interstitial pulmonary disease, unspecified: Secondary | ICD-10-CM

## 2020-02-19 NOTE — Progress Notes (Signed)
OV 02/19/2020  Subjective:  Patient ID: Steven Pitts, male , DOB: 1946-07-27 , age 74 y.o. , MRN: [id number] , ADDRESS: [id number] [id number]   02/19/2020 -   Chief Complaint  Patient presents with  . Consult    Referred by Dr. Einar Gip for SOB for the past 3 months. Notices the SOB with exertion. Currently not on any oxygen therapy.      Steven Pitts 74 y.o. -has been referred by Dr. Einar Gip cardiologist for evaluation of dyspnea and concern for interstitial lung disease.  According to the patient he has had insidious onset of dyspnea and cough for the last 3 months.  It is progressive.  In fact today when he was roomed in his pulse ox was found to be 84% when he walked in and sat down.  This is a new onset dyspnea.  He has never had this problem before.  There is no wheezing there is no orthopnea there is no proximal nocturnal dyspnea.  He says his cardiac issues have been cleared by Dr. Einar Gip.  Review of the records indicate that patient has hypertension, diabetes, hyperlipidemia obstructive sleep apnea on CPAP and restless leg syndrome.  He did have a chest x-ray March  2021 which I visualized that was concerning for pneumonia according to the notes but to me looks like interstitial lung disease.  It appears he was initially started on Lasix by the primary care team and this helped an improvement but not resolution.  He had echo end of March 2021 that shows grade 1 diastolic dysfunction with ejection fraction 55-60%.  Dr. Einar Gip felt patient was euvolemic after diuresis but still has residual basal crackles [I also auscultate this] and therefore has been referred here to the ILD center.   [facility] Comprehensive ILD Questionnaire  Symptoms:    SYMPTOM SCALE - ILD 02/19/2020   O2 use ra  Shortness of Breath 0 -> 5 scale with 5 being worst (score 6 If unable to do)  At rest 0  Simple tasks - showers, clothes change, eating, shaving 0  Household (dishes,  doing bed, laundry) 3  Shopping 4  Walking level at own pace 2  Walking up Stairs 5  Total (30-36) Dyspnea Score 11  How bad is your cough? 2  How bad is your fatigue yes  How bad is nausea no  How bad is vomiting?  no  How bad is diarrhea? no  How bad is anxiety? x  How bad is depression x       Past Medical History : Positive for diastolic heart failure .  Positive for sleep apnea for the last several years.  Positive for hiatal hernia/acid reflux for the last few decades.  Positive for diabetes for the last several years..  Negative for asthma or COPD.  Negative for collagen vascular disease or vasculitis.  Negative for HIV.  Negative for thyroid disease.  Negative for stroke or seizures.  Negative for mononucleosis.  Negative for blood clots pleurisy.   ROS: Positive for fatigue for the last 3 months.  Positive for joint stiffness and swelling for the last several years.  Has dry eyes and mouth for the last several years.  Has noticed color change in his fingers this past winter.  Otherwise no weight loss or nausea.  He does snore and is being treated for sleep apnea.   FAMILY HISTORY of LUNG DISEASE: Negative for pulmonary fibrosis or COPD or asthma  sarcoid or cystic fibrosis.   EXPOSURE HISTORY: Smokes cigarettes between [place].  35 cigarettes/day.  No passive smoking.  He smoked pipes in the past.  No marijuana use currently but he did smoke quit in 1971 in 1980 very little.  No vaping or electronic cigarettes.  Did not use cocaine at any time.  Do not use IV drug use.   HOME and HOBBY DETAILS : [place].  He was in a damp living environment in [place].  He was exposed to agent orange.  Current home does not have a humidifier.  No mold or mildew no nebulizer machine.  No steam iron use.  No misting [place] inside the house no pet birds or parakeets.  No pet gerbils or hamsters or rabbits.  There is no mold in the [facility]  duct.  Does not do any gardening.  No birds in the house no flood of water damage.  No strong mats.  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  Did do pest control work and Psychologist, counselling in the past.  Inorganic antigen: Positive exposure to [employer] and woodworking Financial trader.  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  However chest x-ray done in 2007 looks clear to me but March 2021 chest x-ray that I personally visualized definitely shows ILD.   Simple office walk 185 feet x  3 laps goal with forehead probe 02/19/2020   O2 used ra  Number laps completed 1/4 of 1 laps  Comments about pace Slow pace with cane  Resting Pulse Ox/HR 88% and 63/min  Final Pulse Ox/HR 83% and 77/min  Desaturated </= 88% Even at rest 88%  Desaturated <= 3% points yes  Got Tachycardic >/= 90/min no  Symptoms at end of test dyspneic  Miscellaneous comments Corrected wuthg 3 L to complete 1 lap with cane      ROS - per HPI     has a past medical history of Abnormality of gait, Anxiety, Diabetes ([place]), Essential and other specified forms of tremor, High blood pressure, Other speech disturbance(784.59), and Parkinson's disease ([place]).   reports that he quit smoking about 31 years ago. His smoking use included cigarettes. He has never used smokeless tobacco.  Past Surgical History:  Procedure Laterality Date  . cataract surgery Right   . HERNIA REPAIR    . NECK SURGERY     x4  . ROTATOR CUFF REPAIR Left     Allergies  Allergen Reactions  . Gadolinium Derivatives Other (See Comments)    Patient sneezed immediately after administering gadolinium/ no treatment/ pt had no other  reaction//jv    Immunization History  Administered Date(s) Administered  . PFIZER SARS-COV-2 Vaccination 11/03/2019, 11/26/2019    Family History  Problem Relation Age of Onset  . Aneurysm Mother   . Alcohol abuse Father   . Parkinson's disease Brother      Current Outpatient Medications:  .  Armodafinil 150 MG tablet, Take 150 mg by mouth every morning., Disp: , Rfl:  .  aspirin EC 81 MG tablet, Take 1 tablet (81 mg total) by mouth  daily., Disp: 90 tablet, Rfl: 3 .  atorvastatin (LIPITOR) 20 MG tablet, Take 20 mg by mouth daily., Disp: , Rfl:  .  carbidopa-levodopa (SINEMET IR) 25-100 MG tablet, TAKE 1 TABLET BY MOUTH 5 TIMES PER DAY, Disp: 150 tablet, Rfl: 11 .  DULoxetine (CYMBALTA) 60 MG capsule, Take 60 mg by mouth daily., Disp: , Rfl:  .  empagliflozin (JARDIANCE) 25 MG TABS tablet, Take 25 mg by mouth daily., Disp: , Rfl:  .  furosemide (LASIX) 20 MG tablet, Take 1 tablet (20 mg total) by mouth daily., Disp: 30 tablet, Rfl: 2 .  HYDROcodone-acetaminophen (NORCO) 10-325 MG per tablet, Take 10-325 tablets by mouth daily., Disp: , Rfl:  .  insulin glargine (LANTUS) 100 UNIT/ML injection, Inject 30 Units into the skin daily., Disp: , Rfl:  .  metFORMIN (GLUCOPHAGE) 1000 MG tablet, Take 1,000 mg by mouth daily., Disp: , Rfl:  .  NEXIUM 40 MG capsule, Take 40 mg by mouth daily., Disp: , Rfl:  .  propranolol (INDERAL) 40 MG tablet, Take 40 mg by mouth 2 (two) times daily. , Disp: , Rfl:  .  Ropinirole HCl 12 MG TB24, TAKE ONE (1) TABLET BY MOUTH EACH DAY, Disp: 90 tablet, Rfl: 1 .  telmisartan-hydrochlorothiazide (MICARDIS HCT) 80-25 MG per tablet, Take 1 tablet by mouth daily. , Disp: , Rfl:       Objective:   Vitals:   02/19/20 0953 02/19/20 1001  BP: 110/74   Pulse: 92   Temp: 98.4 F (36.9 C)   TempSrc: Temporal   SpO2: (!) 84% 97%  Weight: 205 lb (93 kg)   Height: _0  (1.727 m)     Estimated body mass index is 31.17 kg/m as calculated from the following:    Height as of this encounter: _1  (1.727 m).   Weight as of this encounter: 205 lb (93 kg).  _2 @  [employer]   02/19/20 0953  Weight: 205 lb (93 kg)     Physical Exam  General Appearance:    Alert, cooperative, no distress, appears stated age - older , Deconditioned looking - mild has can3 , OBESE  - yes, Sitting on Wheelchair -  no  Head:    Normocephalic, without obvious abnormality, atraumatic  Eyes:    PERRL, conjunctiva/corneas clear,  Ears:    Normal TM's and external ear canals, both ears  Nose:   Nares normal, septum midline, mucosa normal, no drainage    or sinus tenderness. OXYGEN ON  - no . Patient is @ ra   Throat:   Lips, mucosa, and tongue normal; teeth and gums normal. Cyanosis on lips - no  Neck:   Supple, symmetrical, trachea midline, no adenopathy;    thyroid:  no enlargement/tenderness/nodules; no carotid   bruit or JVD  Back:     Symmetric, no curvature, ROM normal, no CVA tenderness  Lungs:     Distress - no , Wheeze no, Barrell Chest - no, Purse lip breathing - no, Crackles -faint basal Velcro crackles  present  Chest Wall:    No tenderness or deformity.    Heart:    Regular rate and rhythm, S1 and S2 normal, no rub   or gallop, Murmur - no  Breast Exam:    NOT DONE  Abdomen:     Soft, non-tender, bowel sounds active all four quadrants,    no masses, no organomegaly. Visceral obesity - yes  Genitalia:   NOT DONE  Rectal:   NOT DONE  Extremities:   Extremities - normal, Has Cane - yes, Clubbing - yes, Edema - no  Pulses:   2+ and symmetric all extremities  Skin:   Stigmata of Connective Tissue Disease - STIGMATA of CONNECTIVE TISSUE DISEASE  - Distal digital fissuring (ie, "mechanic hands") - maybe yes versus dry skin - Distal digital tip ulceration - no0 -Inflammatory arthritis or polyarticular morning joint stiffness ?60 minutes - no - Palmar telangiectasia - no - Raynaud phenomenon - no - Unexplained digital edema - no - Unexplained  fixed rash on the digital extensor surfaces (Gottron's sign) - no ... - Deformities of RA - no - Scleroderma  - no - Malar Rash -  no   Lymph nodes:   Cervical, supraclavicular, and axillary nodes normal  Psychiatric:  Neurologic:   Pleasant - yes, Anxious - no, Flat affect - no  CAm-ICU - neg, Alert and Oriented x 3 - yes, Moves all 4s - yes, Speech - normal, Cognition - intact           Assessment:       ICD-10-CM   1. Dyspnea on exertion  R06.00   2. Abnormal chest x-ray  R93.89   3. Exercise hypoxemia  R09.02   4. History of agent Orange exposure  Z77.098    History and features are very suspicious for interstitial lung disease given his age, male gender, Caucasian ethnicity, occupational work history plus home exposures involving hot dog and bird feather pillow.  Presence of interstitial infiltrates on the chest x-ray and desaturations added to the suspicion.  Most likely diagnosis here is IPF versus NSIP versus hypersensitive pneumonitis.  He needs a full work-up.    Plan:     Patient Instructions     ICD-10-CM   1. Dyspnea on exertion  R06.00   2. Abnormal chest x-ray  R93.89   3. Exercise hypoxemia  R09.02   4. History of agent Orange exposure  Z77.098    My concern that you have a condition called interstitial lung disease of is called pulmonary fibrosis There is a family of diseases with many different varieties Our job is to help you by uncovering what exactly is the problem  Plan -Do blood work -ESR, ACE, ANA, double-stranded DNA, rheumatoid factor, CCP, SSA, SSB, SCL-70, MPO and PR-3, CK, Aldolase -do this today -Do interstitial lung disease questionnaire and give it today itself to the CMA -Do high-resolution CT chest supine and prone inspiratory and expiratory volume -Do full PFT -Do overnight oxygen study on room air -Start 2 L oxygen nasal cannula at rest  and 3L with exertion - at day time   Follow-up -Complete all of the above in the next 2-3  weeks and return to see Dr. Chase Caller or nurse practitioner face-to-face for discussion on the specific diagnosis -and further discussion.  - -It is possible that when you complete the above work-up we already have a specific diagnosis     ( Level 05 visit:  New 60-74 min   in  visit type: on-site physical face to visit  in total care time and counseling or/and coordination of care by this undersigned MD - Dr Brand Males. This includes one or more of the following on this same day 02/19/2020: pre-charting, chart review, note writing, documentation discussion of test results, diagnostic or treatment recommendations, prognosis, risks and benefits of management options, instructions, education, compliance or risk-factor reduction. It excludes time spent by the [place] or office staff in the care of the  patient. Actual time 71 min)   SIGNATURE    Dr. Brand Males, M.D., F.C.C.P,  Pulmonary and Critical Care Medicine Staff Physician, [place] Director - Interstitial Lung Disease  Program  Pulmonary [place] [facility] [place], [place], [address]  Pager: [phone], If no answer or between  15:00h - 7:00h: call [phone] Telephone: [phone]  10:33 AM 02/19/2020

## 2020-02-19 NOTE — Patient Instructions (Addendum)
ICD-10-CM   1. Dyspnea on exertion  R06.00   2. Abnormal chest x-ray  R93.89   3. Exercise hypoxemia  R09.02   4. History of agent Orange exposure  Z77.098    My concern that you have a condition called interstitial lung disease of is called pulmonary fibrosis There is a family of diseases with many different varieties Our job is to help you by uncovering what exactly is the problem  Plan -Do blood work -ESR, ACE, ANA, double-stranded DNA, rheumatoid factor, CCP, SSA, SSB, SCL-70, MPO and PR-3, CK, Aldolase -do this today -Do interstitial lung disease questionnaire and give it today itself to the CMA -Do high-resolution CT chest supine and prone inspiratory and expiratory volume -Do full PFT -Do overnight oxygen study on room air -Start 2 L oxygen nasal cannula at rest  and 3L with exertion - at day time   Follow-up -Complete all of the above in the next 2-3 weeks and return to see Dr. Chase Caller or nurse practitioner face-to-face for discussion on the specific diagnosis -and further discussion.  - -It is possible that when you complete the above work-up we already have a specific diagnosis

## 2020-02-20 DIAGNOSIS — Z77098 Contact with and (suspected) exposure to other hazardous, chiefly nonmedicinal, chemicals: Secondary | ICD-10-CM | POA: Diagnosis not present

## 2020-02-20 DIAGNOSIS — R0902 Hypoxemia: Secondary | ICD-10-CM | POA: Diagnosis not present

## 2020-02-20 DIAGNOSIS — I5032 Chronic diastolic (congestive) heart failure: Secondary | ICD-10-CM | POA: Diagnosis not present

## 2020-02-20 DIAGNOSIS — R9389 Abnormal findings on diagnostic imaging of other specified body structures: Secondary | ICD-10-CM | POA: Diagnosis not present

## 2020-02-20 DIAGNOSIS — R0600 Dyspnea, unspecified: Secondary | ICD-10-CM | POA: Diagnosis not present

## 2020-02-20 NOTE — Telephone Encounter (Signed)
Can you guys please look in to this?

## 2020-02-20 NOTE — Telephone Encounter (Signed)
I have called Steven Pitts she is checking on this for me and will call back

## 2020-02-21 ENCOUNTER — Other Ambulatory Visit (HOSPITAL_COMMUNITY)
Admission: RE | Admit: 2020-02-21 | Discharge: 2020-02-21 | Disposition: A | Discharge status: Home / Self Care | Payer: Medicare PPO | Source: Ambulatory Visit | Attending: Adult Health | Admitting: Adult Health

## 2020-02-21 DIAGNOSIS — Z01812 Encounter for preprocedural laboratory examination: Secondary | ICD-10-CM | POA: Diagnosis not present

## 2020-02-21 DIAGNOSIS — Z20822 Contact with and (suspected) exposure to covid-19: Secondary | ICD-10-CM | POA: Insufficient documentation

## 2020-02-21 LAB — SARS CORONAVIRUS 2 (TAT 6-24 HRS): SARS Coronavirus 2: NEGATIVE

## 2020-02-24 ENCOUNTER — Other Ambulatory Visit: Payer: Self-pay

## 2020-02-24 ENCOUNTER — Ambulatory Visit (INDEPENDENT_AMBULATORY_CARE_PROVIDER_SITE_OTHER): Payer: Medicare PPO | Admitting: Internal Medicine

## 2020-02-24 DIAGNOSIS — R0902 Hypoxemia: Secondary | ICD-10-CM

## 2020-02-24 DIAGNOSIS — I872 Venous insufficiency (chronic) (peripheral): Secondary | ICD-10-CM | POA: Diagnosis not present

## 2020-02-24 DIAGNOSIS — I788 Other diseases of capillaries: Secondary | ICD-10-CM | POA: Diagnosis not present

## 2020-02-24 DIAGNOSIS — Z85828 Personal history of other malignant neoplasm of skin: Secondary | ICD-10-CM | POA: Diagnosis not present

## 2020-02-24 DIAGNOSIS — I8312 Varicose veins of left lower extremity with inflammation: Secondary | ICD-10-CM | POA: Diagnosis not present

## 2020-02-24 DIAGNOSIS — L821 Other seborrheic keratosis: Secondary | ICD-10-CM | POA: Diagnosis not present

## 2020-02-24 DIAGNOSIS — Z77098 Contact with and (suspected) exposure to other hazardous, chiefly nonmedicinal, chemicals: Secondary | ICD-10-CM

## 2020-02-24 DIAGNOSIS — I8311 Varicose veins of right lower extremity with inflammation: Secondary | ICD-10-CM | POA: Diagnosis not present

## 2020-02-24 DIAGNOSIS — L570 Actinic keratosis: Secondary | ICD-10-CM | POA: Diagnosis not present

## 2020-02-24 DIAGNOSIS — J849 Interstitial pulmonary disease, unspecified: Secondary | ICD-10-CM

## 2020-02-24 LAB — PULMONARY FUNCTION TEST
DL/VA % pred: 53 %
DL/VA: 2.16 ml/min/mmHg/L
DLCO unc % pred: 37 %
DLCO unc: 9.03 ml/min/mmHg
FEF 25-75 Post: 1.73 L/sec
FEF 25-75 Pre: 1.66 L/sec
FEF2575-%Change-Post: 4 %
FEF2575-%Pred-Post: 77 %
FEF2575-%Pred-Pre: 74 %
FEV1-%Change-Post: 0 %
FEV1-%Pred-Post: 77 %
FEV1-%Pred-Pre: 77 %
FEV1-Post: 2.3 L
FEV1-Pre: 2.29 L
FEV1FVC-%Change-Post: 3 %
FEV1FVC-%Pred-Pre: 102 %
FEV6-%Change-Post: -3 %
FEV6-%Pred-Post: 77 %
FEV6-%Pred-Pre: 79 %
FEV6-Post: 2.94 L
FEV6-Pre: 3.03 L
FEV6FVC-%Pred-Post: 106 %
FEV6FVC-%Pred-Pre: 106 %
FVC-%Change-Post: -3 %
FVC-%Pred-Post: 72 %
FVC-%Pred-Pre: 74 %
FVC-Post: 2.94 L
FVC-Pre: 3.03 L
Post FEV1/FVC ratio: 78 %
Post FEV6/FVC ratio: 100 %
Pre FEV1/FVC ratio: 76 %
Pre FEV6/FVC Ratio: 100 %
RV % pred: 76 %
RV: 1.81 L
TLC % pred: 72 %
TLC: 4.82 L

## 2020-02-24 NOTE — Progress Notes (Signed)
PFT completed today.  

## 2020-02-25 ENCOUNTER — Institutional Professional Consult (permissible substitution): Payer: Medicare PPO | Admitting: Internal Medicine

## 2020-03-03 DIAGNOSIS — L84 Corns and callosities: Secondary | ICD-10-CM | POA: Diagnosis not present

## 2020-03-03 DIAGNOSIS — M79671 Pain in right foot: Secondary | ICD-10-CM | POA: Diagnosis not present

## 2020-03-03 DIAGNOSIS — B351 Tinea unguium: Secondary | ICD-10-CM | POA: Diagnosis not present

## 2020-03-03 DIAGNOSIS — M79672 Pain in left foot: Secondary | ICD-10-CM | POA: Diagnosis not present

## 2020-03-03 DIAGNOSIS — E119 Type 2 diabetes mellitus without complications: Secondary | ICD-10-CM | POA: Diagnosis not present

## 2020-03-05 ENCOUNTER — Ambulatory Visit (HOSPITAL_BASED_OUTPATIENT_CLINIC_OR_DEPARTMENT_OTHER)
Admission: RE | Admit: 2020-03-05 | Discharge: 2020-03-05 | Disposition: A | Discharge status: Home / Self Care | Payer: Medicare PPO | Source: Ambulatory Visit | Attending: Internal Medicine | Admitting: Internal Medicine

## 2020-03-05 ENCOUNTER — Other Ambulatory Visit: Payer: Self-pay

## 2020-03-05 DIAGNOSIS — R918 Other nonspecific abnormal finding of lung field: Secondary | ICD-10-CM | POA: Diagnosis not present

## 2020-03-05 DIAGNOSIS — J849 Interstitial pulmonary disease, unspecified: Secondary | ICD-10-CM | POA: Diagnosis not present

## 2020-03-12 ENCOUNTER — Ambulatory Visit: Payer: Medicare PPO | Admitting: Adult Health

## 2020-03-19 ENCOUNTER — Other Ambulatory Visit: Payer: Self-pay

## 2020-03-19 ENCOUNTER — Encounter: Payer: Self-pay | Admitting: Adult Health

## 2020-03-19 ENCOUNTER — Ambulatory Visit (INDEPENDENT_AMBULATORY_CARE_PROVIDER_SITE_OTHER): Payer: Medicare PPO | Admitting: Adult Health

## 2020-03-19 ENCOUNTER — Other Ambulatory Visit: Payer: Self-pay | Admitting: Adult Health

## 2020-03-19 VITALS — BP 112/70 | HR 72 | Temp 97.3°F | Ht 68.0 in | Wt 206.4 lb

## 2020-03-19 DIAGNOSIS — J849 Interstitial pulmonary disease, unspecified: Secondary | ICD-10-CM | POA: Diagnosis not present

## 2020-03-19 DIAGNOSIS — R0609 Other forms of dyspnea: Secondary | ICD-10-CM

## 2020-03-19 DIAGNOSIS — J9611 Chronic respiratory failure with hypoxia: Secondary | ICD-10-CM | POA: Diagnosis not present

## 2020-03-19 DIAGNOSIS — R5381 Other malaise: Secondary | ICD-10-CM | POA: Insufficient documentation

## 2020-03-19 DIAGNOSIS — R0600 Dyspnea, unspecified: Secondary | ICD-10-CM | POA: Diagnosis not present

## 2020-03-19 DIAGNOSIS — G4733 Obstructive sleep apnea (adult) (pediatric): Secondary | ICD-10-CM

## 2020-03-19 DIAGNOSIS — Z9989 Dependence on other enabling machines and devices: Secondary | ICD-10-CM

## 2020-03-19 LAB — SEDIMENTATION RATE: Sed Rate: 39 mm/hr — ABNORMAL HIGH (ref 0–20)

## 2020-03-19 NOTE — Patient Instructions (Addendum)
Please continue on oxygen 2 L at rest and 3 L with activity O2 saturation goal is greater than 90%. Please go for lab work as recommended Refer to pulmonary rehab Overnight oximetry testing Follow-up in 4-6 weeks in ILD clinic with Dr. Chase Caller and As needed

## 2020-03-19 NOTE — Assessment & Plan Note (Signed)
Continue on oxygen to keep O2 saturations greater than 88 to 90% 

## 2020-03-19 NOTE — Assessment & Plan Note (Signed)
Refer to pulmonary rehab

## 2020-03-19 NOTE — Assessment & Plan Note (Signed)
Interstitial lung disease with CT chest showing probable UIP pattern.  Patient needs autoimmune and connective tissue lab work.  Check overnight oximetry test Refer to pulmonary rehab Plan  Patient Instructions  Please continue on oxygen 2 L at rest and 3 L with activity O2 saturation goal is greater than 90%. Please go for lab work as recommended Refer to pulmonary rehab Overnight oximetry testing Follow-up in 4-6 weeks in ILD clinic with Dr. Chase Caller and As needed

## 2020-03-19 NOTE — Progress Notes (Signed)
_0  ID: Steven Pitts, male    DOB: 11/13/1945, 74 y.o.   MRN: [id number]  Chief Complaint  Patient presents with  . Follow-up    ILD     Referring provider: Leanna Battles, MD  HPI: 74 year old male former smoker (quit 1990) seen for pulmonary consult Feb 19, 2020 for progressive dyspnea since March 2021 and possible ILD Patient is a former [employer] was exposed to agent orange.  TEST/EVENTS :  High-resolution CT chest March 06, 2020 showed a spectrum of findings compatible with basilar predominant fibrotic interstitial lung disease without frank honeycombing findings are characterized as probable UIP.  03/19/2020 Follow up : ILD  Patient returns for a 1 month follow-up.  Patient was seen last visit for pulmonary consult for shortness of breath since March 2021.  Patient complains over the last 3 months he has been getting progressively worse with shortness of breath with minimum activity, decreased activity tolerance.  Prior to this past year he was able to mow his grass he says he is not able to do that.  He is able to stand still without any significant shortness of breath.  He does do woodworking and feels that he is okay to do this.  He has now been started on oxygen due to desaturations with activities.  He is currently on oxygen 2 L at rest and 3 L with activity.  Patient says he does have a dry cough.  He denies any hemoptysis chest pain orthopnea or PND.  Patient was set up for pulmonary function testing that was done  on February 24, 2020 showed moderate restriction with an FEV1 at 77%, ratio 78, FVC 72%, no significant bronchodilator response, severe diffusing defect with a DLCO at 37%. We discussed increasing his physical reconditioning with pulmonary rehab which he is in agreement.    Allergies  Allergen Reactions  . Gadolinium Derivatives Other (See Comments)    Patient sneezed immediately after administering gadolinium/ no treatment/ pt had no other reaction//jv     Immunization History  Administered Date(s) Administered  . PFIZER SARS-COV-2 Vaccination 11/03/2019, 11/26/2019    Past Medical History:  Diagnosis Date  . Abnormality of gait   . Anxiety   . Diabetes ([place])   . Essential and other specified forms of tremor   . High blood pressure   . Other speech disturbance(784.59)   . Parkinson's disease ([place])     Tobacco History: Social History   Tobacco Use  Smoking Status Former Smoker  . Types: Cigarettes  . Quit date: 12/17/1988  . Years since quitting: 31.2  Smokeless Tobacco Never Used  Tobacco Comment   quit 1980   Counseling given: Not Answered Comment: quit 1980   Outpatient Medications Prior to Visit  Medication Sig Dispense Refill  . albuterol (VENTOLIN HFA) 108 (90 Base) MCG/ACT inhaler INHALE 2 PUFFS EVERY 4-6 HOURS AS NEEDED FOR COUGH/SHORTNESS OF BREATH    . Armodafinil 150 MG tablet Take 150 mg by mouth every morning.    Marland Kitchen aspirin EC 81 MG tablet Take 1 tablet (81 mg total) by mouth daily. 90 tablet 3  . atorvastatin (LIPITOR) 20 MG tablet Take 20 mg by mouth daily.    . carbidopa-levodopa (SINEMET IR) 25-100 MG tablet TAKE 1 TABLET BY MOUTH 5 TIMES PER DAY 150 tablet 11  . DULoxetine (CYMBALTA) 60 MG capsule Take 60 mg by mouth daily.    . empagliflozin (JARDIANCE) 25 MG TABS tablet Take 25 mg by mouth daily.    Marland Kitchen  furosemide (LASIX) 20 MG tablet Take 1 tablet (20 mg total) by mouth daily. 30 tablet 2  . gabapentin (NEURONTIN) 300 MG capsule Take by mouth.    Marland Kitchen HYDROcodone-acetaminophen (NORCO) 10-325 MG per tablet Take 10-325 tablets by mouth daily.    . insulin glargine (LANTUS) 100 UNIT/ML injection Inject 30 Units into the skin daily.    . insulin lispro (HUMALOG) 100 UNIT/ML KwikPen INJECT 14 UNITS AT BREAKFAST AND 10 UNITS AT LUNCH AND SUPPER UNDER THE SKIN AS DIRECTED    . metFORMIN (GLUCOPHAGE) 1000 MG tablet Take 1,000 mg by mouth daily.    Marland Kitchen NEXIUM 40 MG capsule Take 40 mg by mouth daily.    Marland Kitchen NOVOFINE  PLUS 32G X 4 MM MISC USE WITH TOUJEO AND HUMALOG PEND FOUR TIMES DAILY    . propranolol (INDERAL) 40 MG tablet Take 40 mg by mouth 2 (two) times daily.     . Ropinirole HCl 12 MG TB24 TAKE ONE (1) TABLET BY MOUTH EACH DAY 90 tablet 1  . telmisartan-hydrochlorothiazide (MICARDIS HCT) 80-25 MG per tablet Take 1 tablet by mouth daily.      No facility-administered medications prior to visit.     Review of Systems:   Constitutional:   No  weight loss, night sweats,  Fevers, chills, + fatigue, or  lassitude.  HEENT:   No headaches,  Difficulty swallowing,  Tooth/dental problems, or  Sore throat,                No sneezing, itching, ear ache, nasal congestion, post nasal drip,   CV:  No chest pain,  Orthopnea, PND, swelling in lower extremities, anasarca, dizziness, palpitations, syncope.   GI  No heartburn, indigestion, abdominal pain, nausea, vomiting, diarrhea, change in bowel habits, loss of appetite, bloody stools.   Resp:    No chest wall deformity  Skin: no rash or lesions.  GU: no dysuria, change in color of urine, no urgency or frequency.  No flank pain, no hematuria   MS:  No joint pain or swelling.  No decreased range of motion.  No back pain.    Physical Exam  BP 112/70 (BP Location: Left Arm, Cuff Size: Normal)   Pulse 72   Temp (!) 97.3 F (36.3 C) (Oral)   Ht _0  (1.727 m)   Wt 206 lb 6.4 oz (93.6 kg)   SpO2 92%   BMI 31.38 kg/m   GEN: A/Ox3; pleasant , NAD, well nourished , elderly on oxygen   HEENT:  [place]/AT,  EACs-clear, TMs-wnl, NOSE-clear, THROAT-clear, no lesions, no postnasal drip or exudate noted.   NECK:  Supple w/ fair ROM; no JVD; normal carotid impulses w/o bruits; no thyromegaly or nodules palpated; no lymphadenopathy.    RESP faint bibasilar crackles no accessory muscle use, no dullness to percussion  CARD:  RRR, no m/r/g, no peripheral edema, pulses intact, no cyanosis or clubbing.  GI:   Soft & nt; nml bowel sounds; no organomegaly or  masses detected.   Musco: Warm bil, no deformities or joint swelling noted.   Neuro: alert, no focal deficits noted.    Skin: Warm, no lesions or rashes    Lab Results:   ProBNP No results found for: PROBNP  Imaging: CT Chest High Resolution  Result Date: 03/06/2020 CLINICAL DATA:  Chronic dyspnea for 4 months. Former smoker per, quit 30 years prior. EXAM: CT CHEST WITHOUT CONTRAST TECHNIQUE: Multidetector CT imaging of the chest was performed following the standard protocol without intravenous  contrast. High resolution imaging of the lungs, as well as inspiratory and expiratory imaging, was performed. COMPARISON:  12/22/2019 chest radiograph. FINDINGS: Cardiovascular: Mild cardiomegaly. No significant pericardial effusion/thickening. Three-vessel coronary atherosclerosis. Atherosclerotic nonaneurysmal thoracic aorta. Dilated main pulmonary artery (3.5 cm diameter). Mediastinum/Nodes: Hypodense 1.4 cm right thyroid nodule. Not clinically significant; no follow-up imaging recommended (ref: J Am Coll Radiol. 2015 Feb;12(2): 143-50). Unremarkable esophagus. No axillary adenopathy. Enlarged right paratracheal nodes up to 1.5 cm (series 4/image 57). Enlarged 1.3 cm right subcarinal node (series 4/image 80). Enlarged 1.6 cm AP window node (series 4/image 53). Suggestion of mild bilateral hilar adenopathy, poorly delineated on these noncontrast images. Lungs/Pleura: No pneumothorax. No pleural effusion. Moderate centrilobular and paraseptal emphysema with diffuse bronchial wall thickening. No acute consolidative airspace disease or lung masses. Peripheral right middle lobe 7 mm solid pulmonary nodule (series 6/image 105). No additional significant pulmonary nodules. No significant lobular air trapping or evidence of tracheobronchomalacia on the expiration sequence. There is moderate patchy confluent subpleural reticulation and ground-glass opacity in both lungs with a clear basilar predominance and with  associated mild traction bronchiectasis and architectural distortion. No frank honeycombing. Upper abdomen: Simple 1.3 cm upper right renal cyst. Musculoskeletal: No aggressive appearing focal osseous lesions. Partially visualized surgical hardware from ACDF. Moderate thoracic spondylosis. IMPRESSION: 1. Spectrum of findings compatible with basilar predominant fibrotic interstitial lung disease without frank honeycombing. Findings are categorized as probable UIP per consensus guidelines: Diagnosis of Idiopathic Pulmonary Fibrosis: An Official ATS/ERS/JRS/ALAT Clinical Practice Guideline. [place], Iss 5, [phone], May 26 2017. 2. Dilated main pulmonary artery, suggesting pulmonary arterial hypertension. 3. Right middle lobe 7 mm solid pulmonary nodule. Non-contrast chest CT at 6-12 months is recommended. If the nodule is stable at time of repeat CT, then future CT at 18-24 months (from today's scan) is considered optional for low-risk patients, but is recommended for high-risk patients. This recommendation follows the consensus statement: Guidelines for Management of Incidental Pulmonary Nodules Detected on CT Images: From the Fleischner Society 2017; Radiology 2017; 284:228-243. 4. Nonspecific mild to moderate mediastinal and mild bilateral hilar lymphadenopathy, potentially reactive. Recommend attention on follow-up chest CT with IV contrast in 3-6 months. 5. Moderate emphysema with diffuse bronchial wall thickening, suggesting COPD. 6. Three-vessel coronary atherosclerosis. 7. Aortic Atherosclerosis (ICD10-I70.0) and Emphysema (ICD10-J43.9). Electronically Signed   By: Ilona Sorrel M.D.   On: 03/06/2020 14:45      PFT Results Latest Ref Rng & Units 02/24/2020  FVC-Pre L 3.03  FVC-Predicted Pre % 74  FVC-Post L 2.94  FVC-Predicted Post % 72  Pre FEV1/FVC % % 76  Post FEV1/FCV % % 78  FEV1-Pre L 2.29  FEV1-Predicted Pre % 77  FEV1-Post L 2.30  DLCO UNC% % 37  DLCO COR  %Predicted % 53  TLC L 4.82  TLC % Predicted % 72  RV % Predicted % 76    No results found for: NITRICOXIDE      Assessment & Plan:   No problem-specific Assessment & Plan notes found for this encounter.     [name], NP 03/19/2020

## 2020-03-22 ENCOUNTER — Telehealth (HOSPITAL_COMMUNITY): Payer: Self-pay

## 2020-03-22 ENCOUNTER — Telehealth: Payer: Self-pay | Admitting: Adult Health

## 2020-03-22 DIAGNOSIS — R9389 Abnormal findings on diagnostic imaging of other specified body structures: Secondary | ICD-10-CM | POA: Diagnosis not present

## 2020-03-22 DIAGNOSIS — R0902 Hypoxemia: Secondary | ICD-10-CM | POA: Diagnosis not present

## 2020-03-22 DIAGNOSIS — R0600 Dyspnea, unspecified: Secondary | ICD-10-CM | POA: Diagnosis not present

## 2020-03-22 DIAGNOSIS — I5032 Chronic diastolic (congestive) heart failure: Secondary | ICD-10-CM | POA: Diagnosis not present

## 2020-03-22 DIAGNOSIS — J849 Interstitial pulmonary disease, unspecified: Secondary | ICD-10-CM

## 2020-03-22 DIAGNOSIS — Z77098 Contact with and (suspected) exposure to other hazardous, chiefly nonmedicinal, chemicals: Secondary | ICD-10-CM | POA: Diagnosis not present

## 2020-03-22 NOTE — Telephone Encounter (Signed)
Pt insurance is active and benefits verified through [place] $20, DED 0/0 met, out of pocket $4,000/$529.62 met, co-insurance 0%. no pre-authorization required. Passport, Dian Situ, REF# [id number]

## 2020-03-22 NOTE — Telephone Encounter (Signed)
TP is the ONO supposed to be done on room air?  Patient Instructions by Melvenia Needles, NP at 03/19/2020 11:30 AM Author: Melvenia Needles, NP Author Type: Nurse Practitioner Filed: 03/19/2020 11:43 AM  Note Status: Addendum Cosign: Cosign Not Required Encounter Date: 03/19/2020  Editor: Melvenia Needles, NP (Nurse Practitioner)      Prior Versions: 1. Parrett, Fonnie Mu, NP (Nurse Practitioner) at 03/19/2020 11:42 AM - Signed      Please continue on oxygen 2 L at rest and 3 L with activity O2 saturation goal is greater than 90%. Please go for lab work as recommended Refer to pulmonary rehab Overnight oximetry testing Follow-up in 4-6 weeks in ILD clinic with Dr. Chase Caller and As needed

## 2020-03-22 NOTE — Telephone Encounter (Signed)
Patient is requesting order for POC. He understands that they are on back order. He would like to get on the list at his DME. Please advise.

## 2020-03-23 ENCOUNTER — Telehealth (HOSPITAL_COMMUNITY): Payer: Self-pay

## 2020-03-23 ENCOUNTER — Encounter (HOSPITAL_COMMUNITY): Payer: Self-pay | Admitting: *Deleted

## 2020-03-23 LAB — ANTI-SCLERODERMA ANTIBODY: Scleroderma (Scl-70) (ENA) Antibody, IgG: <1 AI

## 2020-03-23 LAB — ANTI-NUCLEAR AB-TITER (ANA TITER)
ANA TITER: 1:80 {titer} — ABNORMAL HIGH
ANA Titer 1: 1:40 {titer} — ABNORMAL HIGH

## 2020-03-23 LAB — CYCLIC CITRUL PEPTIDE ANTIBODY, IGG: Cyclic Citrullin Peptide Ab: <16 UNITS

## 2020-03-23 LAB — SJOGREN'S SYNDROME ANTIBODS(SSA + SSB)
SSA (Ro) (ENA) Antibody, IgG: <1 AI
SSB (La) (ENA) Antibody, IgG: <1 AI

## 2020-03-23 LAB — ANTI-DNA ANTIBODY, DOUBLE-STRANDED: ds DNA Ab: 1 IU/mL

## 2020-03-23 LAB — MPO/PR-3 (ANCA) ANTIBODIES
Myeloperoxidase Abs: <1 AI
Serine Protease 3: <1 AI

## 2020-03-23 LAB — RHEUMATOID FACTOR: Rheumatoid fact SerPl-aCnc: <14 IU/mL (ref <14)

## 2020-03-23 LAB — ANGIOTENSIN CONVERTING ENZYME: Angiotensin-Converting Enzyme: 26 U/L (ref 9–67)

## 2020-03-23 LAB — ALDOLASE: Aldolase: 6 U/L (ref <=8.1)

## 2020-03-23 LAB — ANA: Anti Nuclear Antibody (ANA): POSITIVE — AB

## 2020-03-23 NOTE — Progress Notes (Signed)
Referral received from Dr. Chase Caller for pulmonary rehab, patient is appropriate, chart given back to support staff for scheduling.

## 2020-03-26 NOTE — Telephone Encounter (Signed)
ATC Leah[facility] [phone] ext [phone] Left message letting her know that order for patient has been fixed and now says on room air. Nothing further needed at this time.

## 2020-03-26 NOTE — Addendum Note (Signed)
Addended by: Lia Foyer R on: 03/26/2020 01:10 PM   Modules accepted: Orders

## 2020-03-26 NOTE — Telephone Encounter (Signed)
Yes please do ONO on room air .  Thanks

## 2020-03-26 NOTE — Telephone Encounter (Signed)
Of course that is fine for POC order , thanks so much

## 2020-03-30 NOTE — Addendum Note (Signed)
Addended by: Desmond Dike C on: 03/30/2020 10:43 AM   Modules accepted: Orders

## 2020-03-31 DIAGNOSIS — G4733 Obstructive sleep apnea (adult) (pediatric): Secondary | ICD-10-CM | POA: Diagnosis not present

## 2020-04-12 ENCOUNTER — Other Ambulatory Visit: Payer: Self-pay | Admitting: Cardiology

## 2020-04-12 DIAGNOSIS — I5031 Acute diastolic (congestive) heart failure: Secondary | ICD-10-CM

## 2020-04-13 ENCOUNTER — Telehealth (HOSPITAL_COMMUNITY): Payer: Self-pay | Admitting: *Deleted

## 2020-04-14 ENCOUNTER — Encounter (HOSPITAL_COMMUNITY)
Admission: RE | Admit: 2020-04-14 | Discharge: 2020-04-14 | Disposition: A | Discharge status: Home / Self Care | Payer: Medicare PPO | Source: Ambulatory Visit | Attending: Internal Medicine | Admitting: Internal Medicine

## 2020-04-14 ENCOUNTER — Other Ambulatory Visit: Payer: Self-pay

## 2020-04-14 ENCOUNTER — Encounter (HOSPITAL_COMMUNITY): Payer: Self-pay

## 2020-04-14 ENCOUNTER — Other Ambulatory Visit: Payer: Self-pay | Admitting: Neurology

## 2020-04-14 VITALS — BP 112/64 | HR 64 | Ht 68.0 in | Wt 206.1 lb

## 2020-04-14 DIAGNOSIS — J849 Interstitial pulmonary disease, unspecified: Secondary | ICD-10-CM | POA: Insufficient documentation

## 2020-04-14 NOTE — Progress Notes (Signed)
Pulmonary Individual Treatment Plan  Patient Details  Name: Steven Pitts MRN: [id number] Date of Birth: [birth year] Referring Provider:     Pulmonary Rehab Walk Test from 04/14/2020 in [place]  Referring Provider Brand Males      Initial Encounter Date:    Pulmonary Rehab Walk Test from 04/14/2020 in [place]  Date 04/14/20      Visit Diagnosis: Interstitial lung disease ([place])  Patient's Home Medications on Admission:   Current Outpatient Medications:    albuterol (VENTOLIN HFA) 108 (90 Base) MCG/ACT inhaler, INHALE 2 PUFFS EVERY 4-6 HOURS AS NEEDED FOR COUGH/SHORTNESS OF BREATH, Disp: , Rfl:    Armodafinil 150 MG tablet, Take 150 mg by mouth every morning., Disp: , Rfl:    aspirin EC 81 MG tablet, Take 1 tablet (81 mg total) by mouth daily., Disp: 90 tablet, Rfl: 3   atorvastatin (LIPITOR) 20 MG tablet, Take 20 mg by mouth daily., Disp: , Rfl:    DULoxetine (CYMBALTA) 60 MG capsule, Take 60 mg by mouth daily., Disp: , Rfl:    empagliflozin (JARDIANCE) 25 MG TABS tablet, Take 25 mg by mouth daily., Disp: , Rfl:    furosemide (LASIX) 20 MG tablet, TAKE 1 TABLET BY MOUTH DAILY, Disp: 90 tablet, Rfl: 1   gabapentin (NEURONTIN) 300 MG capsule, Take by mouth., Disp: , Rfl:    HYDROcodone-acetaminophen (NORCO) 10-325 MG per tablet, Take 10-325 tablets by mouth daily., Disp: , Rfl:    insulin glargine (LANTUS) 100 UNIT/ML injection, Inject 30 Units into the skin daily., Disp: , Rfl:    insulin lispro (HUMALOG) 100 UNIT/ML KwikPen, INJECT 14 UNITS AT BREAKFAST AND 10 UNITS AT LUNCH AND SUPPER UNDER THE SKIN AS DIRECTED, Disp: , Rfl:    metFORMIN (GLUCOPHAGE) 1000 MG tablet, Take 1,000 mg by mouth daily., Disp: , Rfl:    NEXIUM 40 MG capsule, Take 40 mg by mouth daily., Disp: , Rfl:    NOVOFINE PLUS 32G X 4 MM MISC, USE WITH TOUJEO AND HUMALOG PEND FOUR TIMES DAILY, Disp: , Rfl:    propranolol  (INDERAL) 40 MG tablet, Take 40 mg by mouth 2 (two) times daily. , Disp: , Rfl:    Ropinirole HCl 12 MG TB24, TAKE ONE (1) TABLET BY MOUTH EACH DAY, Disp: 90 tablet, Rfl: 1   telmisartan-hydrochlorothiazide (MICARDIS HCT) 80-25 MG per tablet, Take 1 tablet by mouth daily. , Disp: , Rfl:    carbidopa-levodopa (SINEMET IR) 25-100 MG tablet, TAKE 1 TABLET BY MOUTH 5 TIMES PER DAY, Disp: 150 tablet, Rfl: 5  Past Medical History: Past Medical History:  Diagnosis Date   Abnormality of gait    Anxiety    Diabetes ([place])    Essential and other specified forms of tremor    High blood pressure    Other speech disturbance(784.59)    Parkinson's disease (HCC)     Tobacco Use: Social History   Tobacco Use  Smoking Status Former Smoker   Types: Cigarettes   Quit date: 12/17/1988   Years since quitting: 31.3  Smokeless Tobacco Never Used  Tobacco Comment   quit 1980    Labs: Recent Review Flowsheet Data   There is no flowsheet data to display.     Capillary Blood Glucose: Lab Results  Component Value Date   GLUCAP 117 (H) 08/24/2008   GLUCAP 116 (H) 08/24/2008     Pulmonary Assessment Scores:  Pulmonary Assessment Scores    Row Name 04/14/20  1012         ADL UCSD   ADL Phase Entry       mMRC Score   mMRC Score 2           UCSD: Self-administered rating of dyspnea associated with activities of daily living (ADLs) 6-point scale (0 = "not at all" to 5 = "maximal or unable to do because of breathlessness")  Scoring Scores range from 0 to 120.  Minimally important difference is 5 units  CAT: CAT can identify the health impairment of COPD patients and is better correlated with disease progression.  CAT has a scoring range of zero to 40. The CAT score is classified into four groups of low (less than 10), medium (10 - 20), high (21-30) and very high (31-40) based on the impact level of disease on health status. A CAT score over 10 suggests significant symptoms.   A worsening CAT score could be explained by an exacerbation, poor medication adherence, poor inhaler technique, or progression of COPD or comorbid conditions.  CAT MCID is 2 points  mMRC: mMRC (Modified Medical Research Council) Dyspnea Scale is used to assess the degree of baseline functional disability in patients of respiratory disease due to dyspnea. No minimal important difference is established. A decrease in score of 1 point or greater is considered a positive change.   Pulmonary Function Assessment:  Pulmonary Function Assessment - 04/14/20 0923      Breath   Bilateral Breath Sounds Clear    Shortness of Breath Limiting activity;Fear of Shortness of Breath;Yes           Exercise Target Goals: Exercise Program Goal: Individual exercise prescription set using results from initial 6 min walk test and THRR while considering  patients activity barriers and safety.   Exercise Prescription Goal: Initial exercise prescription builds to 30-45 minutes a day of aerobic activity, 2-3 days per week.  Home exercise guidelines will be given to patient during program as part of exercise prescription that the participant will acknowledge.  Activity Barriers & Risk Stratification:  Activity Barriers & Cardiac Risk Stratification - 04/14/20 0917      Activity Barriers & Cardiac Risk Stratification   Activity Barriers Neck/Spine Problems;Arthritis;Back Problems;Deconditioning;Muscular Weakness;Shortness of Breath;History of Falls;Assistive Device;Balance Concerns           6 Minute Walk:  6 Minute Walk    Row Name 04/14/20 1012         6 Minute Walk   Phase Initial     Distance 592 feet     Walk Time 6 minutes     # of Rest Breaks 1  took break to readjust SpO2 monitor on fingers. Aprrox. 30 seconds     MPH 1.12     METS 1.17     RPE 13     Perceived Dyspnea  1     VO2 Peak 4.09     Symptoms Yes (comment)     Comments SOB PPD = 1. No other complaints.     Resting HR 69 bpm      Resting BP 112/64     Resting Oxygen Saturation  95 %     Exercise Oxygen Saturation  during 6 min walk 84 %     Max Ex. HR 79 bpm     Max Ex. BP 116/70     2 Minute Post BP 110/70       Interval HR   1 Minute HR 77     2  Minute HR 79     3 Minute HR 77     4 Minute HR 76     5 Minute HR 77     6 Minute HR 86     2 Minute Post HR 67     Interval Heart Rate? Yes       Interval Oxygen   Interval Oxygen? Yes     Baseline Oxygen Saturation % 95 %     1 Minute Oxygen Saturation % 89 %     1 Minute Liters of Oxygen 3 L     2 Minute Oxygen Saturation % 85 %  Increased to 4L/min at min 2     2 Minute Liters of Oxygen 4 L     3 Minute Oxygen Saturation % 84 %     3 Minute Liters of Oxygen 4 L     4 Minute Oxygen Saturation % 87 %     4 Minute Liters of Oxygen 6 L     5 Minute Oxygen Saturation % 92 %     5 Minute Liters of Oxygen 6 L     6 Minute Oxygen Saturation % 90 %     6 Minute Liters of Oxygen 6 L     2 Minute Post Oxygen Saturation % 95 %     2 Minute Post Liters of Oxygen 3 L            Oxygen Initial Assessment:  Oxygen Initial Assessment - 04/14/20 0922      Home Oxygen   Home Oxygen Device Home Concentrator;Portable Concentrator    Sleep Oxygen Prescription CPAP    Liters per minute 3    Home Exercise Oxygen Prescription Pulsed    Liters per minute 3    Home at Rest Exercise Oxygen Prescription Pulsed    Liters per minute 3    Compliance with Home Oxygen Use Yes      Initial 6 min Walk   Oxygen Used Continuous    Liters per minute 6      Program Oxygen Prescription   Program Oxygen Prescription Continuous    Liters per minute 4   3-4 L/min with nustep     Intervention   Short Term Goals To learn and exhibit compliance with exercise, home and travel O2 prescription;To learn and understand importance of monitoring SPO2 with pulse oximeter and demonstrate accurate use of the pulse oximeter.;To learn and understand importance of maintaining oxygen  saturations>88%;To learn and demonstrate proper pursed lip breathing techniques or other breathing techniques.;To learn and demonstrate proper use of respiratory medications    Long  Term Goals Exhibits compliance with exercise, home and travel O2 prescription;Verbalizes importance of monitoring SPO2 with pulse oximeter and return demonstration;Maintenance of O2 saturations>88%;Exhibits proper breathing techniques, such as pursed lip breathing or other method taught during program session;Compliance with respiratory medication;Demonstrates proper use of MDIs           Oxygen Re-Evaluation:   Oxygen Discharge (Final Oxygen Re-Evaluation):   Initial Exercise Prescription:  Initial Exercise Prescription - 04/14/20 1000      Date of Initial Exercise RX and Referring Provider   Date 04/14/20    Referring Provider Brand Males    Expected Discharge Date 06/17/20      Oxygen   Oxygen Continuous    Liters 4      NuStep   Level 1    SPM 65    Minutes 30    METs 1.6  Prescription Details   Frequency (times per week) 2    Duration Progress to 30 minutes of continuous aerobic without signs/symptoms of physical distress      Intensity   THRR 40-80% of Max Heartrate 60-119    Ratings of Perceived Exertion 11-13    Perceived Dyspnea 0-4      Progression   Progression Continue progressive overload as per policy without signs/symptoms or physical distress.      Resistance Training   Training Prescription Yes    Weight Theraband    Reps 10-15           Perform Capillary Blood Glucose checks as needed.  Exercise Prescription Changes:   Exercise Comments:   Exercise Goals and Review:  Exercise Goals    Row Name 04/14/20 1021             Exercise Goals   Increase Physical Activity Yes       Intervention Provide advice, education, support and counseling about physical activity/exercise needs.;Develop an individualized exercise prescription for aerobic and  resistive training based on initial evaluation findings, risk stratification, comorbidities and participant's personal goals.       Expected Outcomes Short Term: Attend rehab on a regular basis to increase amount of physical activity.;Long Term: Add in home exercise to make exercise part of routine and to increase amount of physical activity.;Long Term: Exercising regularly at least 3-5 days a week.       Increase Strength and Stamina Yes       Intervention Provide advice, education, support and counseling about physical activity/exercise needs.;Develop an individualized exercise prescription for aerobic and resistive training based on initial evaluation findings, risk stratification, comorbidities and participant's personal goals.       Expected Outcomes Short Term: Increase workloads from initial exercise prescription for resistance, speed, and METs.;Short Term: Perform resistance training exercises routinely during rehab and add in resistance training at home;Long Term: Improve cardiorespiratory fitness, muscular endurance and strength as measured by increased METs and functional capacity (6MWT)       Able to understand and use rate of perceived exertion (RPE) scale Yes       Intervention Provide education and explanation on how to use RPE scale       Expected Outcomes Short Term: Able to use RPE daily in rehab to express subjective intensity level;Long Term:  Able to use RPE to guide intensity level when exercising independently       Able to understand and use Dyspnea scale Yes       Intervention Provide education and explanation on how to use Dyspnea scale       Expected Outcomes Short Term: Able to use Dyspnea scale daily in rehab to express subjective sense of shortness of breath during exertion;Long Term: Able to use Dyspnea scale to guide intensity level when exercising independently       Knowledge and understanding of Target Heart Rate Range (THRR) Yes       Intervention Provide education and  explanation of THRR including how the numbers were predicted and where they are located for reference       Expected Outcomes Short Term: Able to state/look up THRR;Long Term: Able to use THRR to govern intensity when exercising independently;Short Term: Able to use daily as guideline for intensity in rehab       Understanding of Exercise Prescription Yes       Intervention Provide education, explanation, and written materials on patient's individual exercise prescription  Expected Outcomes Short Term: Able to explain program exercise prescription;Long Term: Able to explain home exercise prescription to exercise independently              Exercise Goals Re-Evaluation :   Discharge Exercise Prescription (Final Exercise Prescription Changes):   Nutrition:  Target Goals: Understanding of nutrition guidelines, daily intake of sodium <1559m, cholesterol <2085m calories 30% from fat and 7% or less from saturated fats, daily to have 5 or more servings of fruits and vegetables.  Biometrics:  Pre Biometrics - 04/14/20 0922      Pre Biometrics   Triceps Skinfold 30 mm            Nutrition Therapy Plan and Nutrition Goals:   Nutrition Assessments:   Nutrition Goals Re-Evaluation:   Nutrition Goals Discharge (Final Nutrition Goals Re-Evaluation):   Psychosocial: Target Goals: Acknowledge presence or absence of significant depression and/or stress, maximize coping skills, provide positive support system. Participant is able to verbalize types and ability to use techniques and skills needed for reducing stress and depression.  Initial Review & Psychosocial Screening:  Initial Psych Review & Screening - 04/14/20 [id number]    Initial Review   Current issues with Current Depression;History of Depression   is on cymbalta and it helps keep his depression stable,     Family Dynamics   Good Support System? Yes      Barriers   Psychosocial barriers to participate in program The  patient should benefit from training in stress management and relaxation.      Screening Interventions   Interventions Encouraged to exercise    Expected Outcomes --   Has chronic illnesses, unable to perform activities he use to enjoy.          Quality of Life Scores:  Scores of 19 and below usually indicate a poorer quality of life in these areas.  A difference of  2-3 points is a clinically meaningful difference.  A difference of 2-3 points in the total score of the Quality of Life Index has been associated with significant improvement in overall quality of life, self-image, physical symptoms, and general health in studies assessing change in quality of life.  PHQ-9: Recent Review Flowsheet Data    [facility]   Decreased Interest 0   Down, Depressed, Hopeless 0   PHQ - 2 Score 0   Altered sleeping 0   Tired, decreased energy 0   Change in appetite 0   Feeling bad or failure about yourself  0   Trouble concentrating 1   Moving slowly or fidgety/restless 0   Suicidal thoughts 0   PHQ-9 Score 1   Difficult doing work/chores Not difficult at all     Interpretation of Total Score  Total Score Depression Severity:  1-4 = Minimal depression, 5-9 = Mild depression, 10-14 = Moderate depression, 15-19 = Moderately severe depression, 20-27 = Severe depression   Psychosocial Evaluation and Intervention:  Psychosocial Evaluation - 04/14/20 0927      Psychosocial Evaluation & Interventions   Interventions Stress management education;Relaxation education;Encouraged to exercise with the program and follow exercise prescription    Comments Depression is stable at this point    Continue Psychosocial Services  No Follow up required           Psychosocial Re-Evaluation:   Psychosocial Discharge (Final Psychosocial Re-Evaluation):   Education: Education Goals: Education classes will be provided on a weekly basis, covering required topics. Participant will  state understanding/return  demonstration of topics presented.  Learning Barriers/Preferences:  Learning Barriers/Preferences - 04/14/20 0927      Learning Barriers/Preferences   Learning Barriers --   has a major tremor, his wife assists him in filling out paperwork   Learning Preferences Computer/Internet           Education Topics: Risk Factor Reduction:  -Group instruction that is supported by a PowerPoint presentation. Instructor discusses the definition of a risk factor, different risk factors for pulmonary disease, and how the heart and lungs work together.     Nutrition for Pulmonary Patient:  -Group instruction provided by PowerPoint slides, verbal discussion, and written materials to support subject matter. The instructor gives an explanation and review of healthy diet recommendations, which includes a discussion on weight management, recommendations for fruit and vegetable consumption, as well as protein, fluid, caffeine, fiber, sodium, sugar, and alcohol. Tips for eating when patients are short of breath are discussed.   Pursed Lip Breathing:  -Group instruction that is supported by demonstration and informational handouts. Instructor discusses the benefits of pursed lip and diaphragmatic breathing and detailed demonstration on how to preform both.     Oxygen Safety:  -Group instruction provided by PowerPoint, verbal discussion, and written material to support subject matter. There is an overview of What is Oxygen and Why do we need it.  Instructor also reviews how to create a safe environment for oxygen use, the importance of using oxygen as prescribed, and the risks of noncompliance. There is a brief discussion on traveling with oxygen and resources the patient may utilize.   Oxygen Equipment:  -Group instruction provided by [facility] Staff utilizing handouts, written materials, and equipment demonstrations.   Signs and Symptoms:  -Group instruction provided by  written material and verbal discussion to support subject matter. Warning signs and symptoms of infection, stroke, and heart attack are reviewed and when to call the physician/911 reinforced. Tips for preventing the spread of infection discussed.   Advanced Directives:  -Group instruction provided by verbal instruction and written material to support subject matter. Instructor reviews Advanced Directive laws and proper instruction for filling out document.   Pulmonary Video:  -Group video education that reviews the importance of medication and oxygen compliance, exercise, good nutrition, pulmonary hygiene, and pursed lip and diaphragmatic breathing for the pulmonary patient.   Exercise for the Pulmonary Patient:  -Group instruction that is supported by a PowerPoint presentation. Instructor discusses benefits of exercise, core components of exercise, frequency, duration, and intensity of an exercise routine, importance of utilizing pulse oximetry during exercise, safety while exercising, and options of places to exercise outside of rehab.     Pulmonary Medications:  -Verbally interactive group education provided by instructor with focus on inhaled medications and proper administration.   Anatomy and Physiology of the Respiratory System and Intimacy:  -Group instruction provided by PowerPoint, verbal discussion, and written material to support subject matter. Instructor reviews respiratory cycle and anatomical components of the respiratory system and their functions. Instructor also reviews differences in obstructive and restrictive respiratory diseases with examples of each. Intimacy, Sex, and Sexuality differences are reviewed with a discussion on how relationships can change when diagnosed with pulmonary disease. Common sexual concerns are reviewed.   MD DAY -A group question and answer session with a medical doctor that allows participants to ask questions that relate to their pulmonary  disease state.   OTHER EDUCATION -Group or individual verbal, written, or video instructions that support the educational goals of the pulmonary  rehab program.   Holiday Eating Survival Tips:  -Group instruction provided by PowerPoint slides, verbal discussion, and written materials to support subject matter. The instructor gives patients tips, tricks, and techniques to help them not only survive but enjoy the holidays despite the onslaught of food that accompanies the holidays.   Knowledge Questionnaire Score:   Core Components/Risk Factors/Patient Goals at Admission:  Personal Goals and Risk Factors at Admission - 04/14/20 0929      Core Components/Risk Factors/Patient Goals on Admission   Improve shortness of breath with ADL's Yes    Intervention Provide education, individualized exercise plan and daily activity instruction to help decrease symptoms of SOB with activities of daily living.    Expected Outcomes Short Term: Improve cardiorespiratory fitness to achieve a reduction of symptoms when performing ADLs;Long Term: Be able to perform more ADLs without symptoms or delay the onset of symptoms           Core Components/Risk Factors/Patient Goals Review:   Goals and Risk Factor Review    Row Name 04/14/20 0929             Core Components/Risk Factors/Patient Goals Review   Personal Goals Review Increase knowledge of respiratory medications and ability to use respiratory devices properly.;Improve shortness of breath with ADL's;Develop more efficient breathing techniques such as purse lipped breathing and diaphragmatic breathing and practicing self-pacing with activity.              Core Components/Risk Factors/Patient Goals at Discharge (Final Review):   Goals and Risk Factor Review - 04/14/20 0929      Core Components/Risk Factors/Patient Goals Review   Personal Goals Review Increase knowledge of respiratory medications and ability to use respiratory devices  properly.;Improve shortness of breath with ADL's;Develop more efficient breathing techniques such as purse lipped breathing and diaphragmatic breathing and practicing self-pacing with activity.           ITP Comments:   Comments:

## 2020-04-14 NOTE — Progress Notes (Signed)
Steven Pitts 74 y.o. male Pulmonary Rehab Orientation Note Patient arrived today in Cardiac and Pulmonary Rehab for orientation to Pulmonary Rehab. He walked from the [facility] parking garage with moderate shortness of breath. He does carry portable oxygen. Per pt, he uses oxygen continuously. Color good, skin warm and dry. Patient is oriented to time and place. Patient's medical history, psychosocial health, and medications reviewed. Psychosocial assessment reveals pt lives with their spouse. 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 Hobbies include woodworking. Pt reports his stress level is moderate. Areas of stress/anxiety include Health and inability to participate in activities he use to enjoy.  Pt does not exhibit  signs of depression, he has a history and current depression and takes cymbalta daily and feels that his depression is stable.  PHQ2/9 score 0/1. Pt shows good  coping skills with positive outlook . Will continue to monitor and evaluate progress toward psychosocial goal(s) of mental stability while participating in pulmonary rehab. Physical assessment reveals heart rate is normal, breath sounds clear to auscultation, no wheezes, rales, or rhonchi. Grip strength equal, strong. Patient reports he does take medications as prescribed. Patient states he follows a Regular diet. The patient reports no specific efforts to gain or lose weight.. Patient's weight will be monitored closely. Demonstration and practice of PLB using pulse oximeter. Patient able to return demonstration satisfactorily. Safety and hand hygiene in the exercise area reviewed with patient. Patient voices understanding of the information reviewed. Department expectations discussed with patient and achievable goals were set. The patient shows enthusiasm about attending the program and we look  forward to working with this nice gentleman. The patient is scheduled completed a 6 min walk test today, 7/21/2021and to begin exercise on Tuesday, 04/20/2020 in the 1015 exercise slot.  [phone]

## 2020-04-20 ENCOUNTER — Encounter (HOSPITAL_COMMUNITY)
Admission: RE | Admit: 2020-04-20 | Discharge: 2020-04-20 | Disposition: A | Discharge status: Home / Self Care | Payer: Medicare PPO | Source: Ambulatory Visit | Attending: Internal Medicine | Admitting: Internal Medicine

## 2020-04-20 ENCOUNTER — Other Ambulatory Visit: Payer: Self-pay

## 2020-04-20 VITALS — Wt 204.1 lb

## 2020-04-20 DIAGNOSIS — J849 Interstitial pulmonary disease, unspecified: Secondary | ICD-10-CM

## 2020-04-20 NOTE — Progress Notes (Signed)
Daily Session Note  Patient Details  Name: EBEN CHOINSKI MRN: [id number] Date of Birth: 14-Oct-1945 Referring Provider:     Pulmonary Rehab Walk Test from 04/14/2020 in [place]  Referring Provider Brand Males      Encounter Date: 04/20/2020  Check In:  Session Check In - 04/20/20 1049      Check-In   Supervising physician immediately available to respond to emergencies [facility] immediately available    Physician(s) Dr. Jamse Arn    Location [facility]    Staff Present Hoy Register, MS, CEP, Exercise Physiologist;Lisa Ysidro Evert, RN;Jessica Hassell Done, MS, ACSM-CEP, Exercise Physiologist;Carlette Wilber Oliphant, RN, BSN    Virtual Visit No    Medication changes reported     No    Fall or balance concerns reported    Yes    Tobacco Cessation No Change    Warm-up and Cool-down Performed on first and last piece of equipment    Resistance Training Performed Yes    VAD Patient? No    PAD/SET Patient? No      Pain Assessment   Currently in Pain? No/denies    Pain Score 0-No pain    Multiple Pain Sites No           Capillary Blood Glucose: No results found for this or any previous visit (from the past 24 hour(s)).  POCT Glucose - 04/20/20 1210      POCT Blood Glucose   Pre-Exercise 111 mg/dL    Post-Exercise 169 mg/dL           Exercise Prescription Changes - 04/20/20 1200      Response to Exercise   Blood Pressure (Admit) 104/60    Blood Pressure (Exercise) 122/70    Blood Pressure (Exit) 104/56    Heart Rate (Admit) 61 bpm    Heart Rate (Exercise) 73 bpm    Heart Rate (Exit) 71 bpm    Oxygen Saturation (Admit) 97 %    Oxygen Saturation (Exercise) 92 %    Oxygen Saturation (Exit) 96 %    Rating of Perceived Exertion (Exercise) 9    Perceived Dyspnea (Exercise) 1    Duration Continue with 30 min of aerobic exercise without signs/symptoms of physical distress.    Intensity THRR unchanged       Progression   Progression Continue to progress workloads to maintain intensity without signs/symptoms of physical distress.      Resistance Training   Training Prescription Yes    Weight blue bands    Reps 10-15    Time 10 Minutes      Oxygen   Oxygen Continuous    Liters 6      NuStep   Level 2    SPM 80    Minutes 30    METs 2.1           Social History   Tobacco Use  Smoking Status Former Smoker  . Types: Cigarettes  . Quit date: 12/17/1988  . Years since quitting: 31.3  Smokeless Tobacco Never Used  Tobacco Comment   quit 1980    Goals Met:  Independence with exercise equipment Exercise tolerated well Strength training completed today  Goals Unmet:  Not Applicable  Comments: Service time is from 1015 to 1130    Dr. Kathie Dike is Medical Director for [facility] Pulmonary Rehab.

## 2020-04-21 DIAGNOSIS — R0600 Dyspnea, unspecified: Secondary | ICD-10-CM | POA: Diagnosis not present

## 2020-04-21 DIAGNOSIS — R9389 Abnormal findings on diagnostic imaging of other specified body structures: Secondary | ICD-10-CM | POA: Diagnosis not present

## 2020-04-21 DIAGNOSIS — Z77098 Contact with and (suspected) exposure to other hazardous, chiefly nonmedicinal, chemicals: Secondary | ICD-10-CM | POA: Diagnosis not present

## 2020-04-21 DIAGNOSIS — I5032 Chronic diastolic (congestive) heart failure: Secondary | ICD-10-CM | POA: Diagnosis not present

## 2020-04-21 DIAGNOSIS — R0902 Hypoxemia: Secondary | ICD-10-CM | POA: Diagnosis not present

## 2020-04-22 ENCOUNTER — Telehealth: Payer: Self-pay | Admitting: Internal Medicine

## 2020-04-22 ENCOUNTER — Encounter (HOSPITAL_COMMUNITY)
Admission: RE | Admit: 2020-04-22 | Discharge: 2020-04-22 | Disposition: A | Discharge status: Home / Self Care | Payer: Medicare PPO | Source: Ambulatory Visit | Attending: Internal Medicine | Admitting: Internal Medicine

## 2020-04-22 ENCOUNTER — Other Ambulatory Visit: Payer: Self-pay

## 2020-04-22 DIAGNOSIS — J849 Interstitial pulmonary disease, unspecified: Secondary | ICD-10-CM | POA: Diagnosis not present

## 2020-04-22 NOTE — Progress Notes (Signed)
Daily Session Note  Patient Details  Name: KURTISS WENCE MRN: [id number] Date of Birth: 1946/07/23 Referring Provider:     Pulmonary Rehab Walk Test from 04/14/2020 in [place]  Referring Provider Brand Males      Encounter Date: 04/22/2020  Check In:  Session Check In - 04/22/20 1002      Check-In   Supervising physician immediately available to respond to emergencies [facility] immediately available    Physician(s) Dr. Wyline Copas    [facility]    Staff Present Maurice Small, RN, Bjorn Loser, MS, CEP, Exercise Physiologist;Adyline Huberty Ysidro Evert, RN;David Cusseta, MS, EP-C, CCRP    Virtual Visit No    Medication changes reported     No    Fall or balance concerns reported    No    Tobacco Cessation No Change    Warm-up and Cool-down Performed on first and last piece of equipment    Resistance Training Performed Yes    VAD Patient? No    PAD/SET Patient? No      Pain Assessment   Currently in Pain? No/denies    Pain Score 0-No pain    Multiple Pain Sites No           Capillary Blood Glucose: No results found for this or any previous visit (from the past 24 hour(s)).    Social History   Tobacco Use  Smoking Status Former Smoker  . Types: Cigarettes  . Quit date: 12/17/1988  . Years since quitting: 31.3  Smokeless Tobacco Never Used  Tobacco Comment   quit 1980    Goals Met:  Exercise tolerated well No report of cardiac concerns or symptoms Strength training completed today  Goals Unmet:  Not Applicable  Comments: Service time is from 1000 to 1108    Dr. Fransico Him is Medical Director for Cardiac Rehab at [facility].

## 2020-04-22 NOTE — Telephone Encounter (Signed)
LMTCB x1 for pt.

## 2020-04-22 NOTE — Telephone Encounter (Signed)
This is fine 

## 2020-04-22 NOTE — Telephone Encounter (Signed)
Spoke with Steven Pitts and he states that at rehab the pt has been requiring 6 lpm rather than 5lpm with exertion  His home concentrator only goes to 5  Needing order sent to DME to have 10 lpm concentrator  Please advise if we can order,  thanks

## 2020-04-26 NOTE — Telephone Encounter (Signed)
Order sent to [place] for patient to get larger concentrator at home. Patient has one that goes to 5 liters and needs one that goes to 10 liters due to patient needing 6 liters with exertion. Nothing further needed at this time.

## 2020-04-27 ENCOUNTER — Other Ambulatory Visit: Payer: Self-pay

## 2020-04-27 ENCOUNTER — Encounter (HOSPITAL_COMMUNITY)
Admission: RE | Admit: 2020-04-27 | Discharge: 2020-04-27 | Disposition: A | Discharge status: Home / Self Care | Payer: Medicare PPO | Source: Ambulatory Visit | Attending: Internal Medicine | Admitting: Internal Medicine

## 2020-04-27 DIAGNOSIS — J849 Interstitial pulmonary disease, unspecified: Secondary | ICD-10-CM | POA: Diagnosis not present

## 2020-04-27 NOTE — Progress Notes (Signed)
Steven Pitts 74 y.o. male Nutrition Note  Visit Diagnosis: Interstitial lung disease [facility])  Past Medical History:  Diagnosis Date  . Abnormality of gait   . Anxiety   . Diabetes ([place])   . Essential and other specified forms of tremor   . High blood pressure   . Other speech disturbance(784.59)   . Parkinson's disease ([place])      Medications reviewed.   Current Outpatient Medications:  .  albuterol (VENTOLIN HFA) 108 (90 Base) MCG/ACT inhaler, INHALE 2 PUFFS EVERY 4-6 HOURS AS NEEDED FOR COUGH/SHORTNESS OF BREATH, Disp: , Rfl:  .  Armodafinil 150 MG tablet, Take 150 mg by mouth every morning., Disp: , Rfl:  .  aspirin EC 81 MG tablet, Take 1 tablet (81 mg total) by mouth daily., Disp: 90 tablet, Rfl: 3 .  atorvastatin (LIPITOR) 20 MG tablet, Take 20 mg by mouth daily., Disp: , Rfl:  .  carbidopa-levodopa (SINEMET IR) 25-100 MG tablet, TAKE 1 TABLET BY MOUTH 5 TIMES PER DAY, Disp: 150 tablet, Rfl: 5 .  DULoxetine (CYMBALTA) 60 MG capsule, Take 60 mg by mouth daily., Disp: , Rfl:  .  empagliflozin (JARDIANCE) 25 MG TABS tablet, Take 25 mg by mouth daily., Disp: , Rfl:  .  furosemide (LASIX) 20 MG tablet, TAKE 1 TABLET BY MOUTH DAILY, Disp: 90 tablet, Rfl: 1 .  gabapentin (NEURONTIN) 300 MG capsule, Take by mouth., Disp: , Rfl:  .  HYDROcodone-acetaminophen (NORCO) 10-325 MG per tablet, Take 10-325 tablets by mouth daily., Disp: , Rfl:  .  insulin glargine (LANTUS) 100 UNIT/ML injection, Inject 30 Units into the skin daily., Disp: , Rfl:  .  insulin lispro (HUMALOG) 100 UNIT/ML KwikPen, INJECT 14 UNITS AT BREAKFAST AND 10 UNITS AT LUNCH AND SUPPER UNDER THE SKIN AS DIRECTED, Disp: , Rfl:  .  metFORMIN (GLUCOPHAGE) 1000 MG tablet, Take 1,000 mg by mouth daily., Disp: , Rfl:  .  NEXIUM 40 MG capsule, Take 40 mg by mouth daily., Disp: , Rfl:  .  NOVOFINE PLUS 32G X 4 MM MISC, USE WITH TOUJEO AND HUMALOG PEND FOUR TIMES DAILY, Disp: , Rfl:  .  propranolol (INDERAL) 40 MG tablet, Take  40 mg by mouth 2 (two) times daily. , Disp: , Rfl:  .  Ropinirole HCl 12 MG TB24, TAKE ONE (1) TABLET BY MOUTH EACH DAY, Disp: 90 tablet, Rfl: 1 .  telmisartan-hydrochlorothiazide (MICARDIS HCT) 80-25 MG per tablet, Take 1 tablet by mouth daily. , Disp: , Rfl:    Ht Readings from Last 1 Encounters:  04/14/20 _0  (1.727 m)     Wt Readings from Last 3 Encounters:  04/20/20 (!) 204 lb 2.3 oz (92.6 kg)  04/14/20 206 lb 2.1 oz (93.5 kg)  03/19/20 206 lb 6.4 oz (93.6 kg)     There is no height or weight on file to calculate BMI.   Social History   Tobacco Use  Smoking Status Former Smoker  . Types: Cigarettes  . Quit date: 12/17/1988  . Years since quitting: 31.3  Smokeless Tobacco Never Used  Tobacco Comment   quit 1980     Nutrition Note  Spoke with pt. Nutrition Plan and Nutrition Survey goals reviewed with pt.   No difficulty preparing food or grocery shopping. His wife typically cooks meals. They eat at home 99% of the time.  He denies unintentional weight loss. GERD - controlled on Nexium. He does not have to alter diet for this.   Pt has Type  2 Diabetes. Last A1c indicates blood glucose well-controlled at 6.8%. Pt checks CBG's 1 times a day. Fasting CBG's reportedly 70-120 mg/dL. He occasionally checks post prandial ~170 mg/dl.  He reports taking Lantus 40 units in am (as opposed to 30 units documented in meds). He says this was prescribed years ago. Reviewed hypoglycemia protocol.  Overall, he has a generally healthy and balanced diet. He does not enjoy non starchy veggies. His wife does try to incorporate them when she can disguise them. He does choose whole grains and starchy veggies (beans) for nutrient density.   Pt expressed understanding of the information reviewed.    Nutrition Diagnosis ? Inadequate fiber intake related to food preferences as evidenced by pt's avoidance of non starchy veggies  Nutrition Intervention ? Pt's individual nutrition plan reviewed  with pt. ? Benefits of adopting healthy diet reviewed with Rate My Plate survey   ? Continue client-centered nutrition education by RD, as part of interdisciplinary care.  Goal(s) ? Pt to build a healthy plate including vegetables, fruits, whole grains, and low-fat dairy products in a healthy meal plan. ? Pt to incorporate fiber rich foods daily to reach goal of minimum fiber intake 28 g/day Plan:   Will provide client-centered nutrition education as part of interdisciplinary care  Monitor and evaluate progress toward nutrition goal with team.   Michaele Offer, MS, RDN, LDN

## 2020-04-27 NOTE — Progress Notes (Signed)
I have reviewed a Home Exercise Prescription with Steven Pitts . Steven Pitts is not currently exercising at home.  The patient was advised to walk and ride the recumbent bike 1-3 days a week for 30-45 minutes.  Steven Pitts and I discussed how to progress their exercise prescription.  The patient stated that their goals were to be more active for his grandchildren and to have more stamina to do his hobbies again.  The patient stated that they understand the exercise prescription.  We reviewed exercise guidelines, target heart rate during exercise, RPE Scale, weather conditions, NTG use, endpoints for exercise, warmup and cool down.  Patient is encouraged to come to me with any questions. I will continue to follow up with the patient to assist them with progression and safety.

## 2020-04-27 NOTE — Progress Notes (Signed)
Daily Session Note  Patient Details  Name: SOMA LIZAK MRN: [id number] Date of Birth: September 21, 1946 Referring Provider:     Pulmonary Rehab Walk Test from 04/14/2020 in [place]  Referring Provider Brand Males      Encounter Date: 04/27/2020  Check In:  Session Check In - 04/27/20 1003      Check-In   Supervising physician immediately available to respond to emergencies [facility] immediately available    Physician(s) Dr. Levora Angel    [facility]    Staff Present Hoy Register, MS, CEP, Exercise Physiologist;Lisa Jani Gravel, MS, ACSM-CEP, Exercise Physiologist    Virtual Visit No    Medication changes reported     No    Fall or balance concerns reported    No    Tobacco Cessation No Change    Warm-up and Cool-down Performed on first and last piece of equipment    Resistance Training Performed Yes    VAD Patient? No    PAD/SET Patient? No      Pain Assessment   Currently in Pain? No/denies    Multiple Pain Sites No           Capillary Blood Glucose: No results found for this or any previous visit (from the past 24 hour(s)).   Exercise Prescription Changes - 04/27/20 1100      Home Exercise Plan   Plans to continue exercise at Home (comment)    Frequency Add 2 additional days to program exercise sessions.    Initial Home Exercises Provided 04/27/20           Social History   Tobacco Use  Smoking Status Former Smoker  . Types: Cigarettes  . Quit date: 12/17/1988  . Years since quitting: 31.3  Smokeless Tobacco Never Used  Tobacco Comment   quit 1980    Goals Met:  Independence with exercise equipment Exercise tolerated well Strength training completed today  Goals Unmet:  Not Applicable  Comments: Service time is from 1000 to 1058    Dr. Fransico Him is Medical Director for Cardiac Rehab at [facility].

## 2020-04-29 ENCOUNTER — Telehealth: Payer: Self-pay | Admitting: Internal Medicine

## 2020-04-29 ENCOUNTER — Encounter (HOSPITAL_COMMUNITY): Payer: Self-pay | Admitting: *Deleted

## 2020-04-29 ENCOUNTER — Encounter (HOSPITAL_COMMUNITY)
Admission: RE | Admit: 2020-04-29 | Discharge: 2020-04-29 | Disposition: A | Discharge status: Home / Self Care | Payer: Medicare PPO | Source: Ambulatory Visit | Attending: Internal Medicine | Admitting: Internal Medicine

## 2020-04-29 ENCOUNTER — Other Ambulatory Visit: Payer: Self-pay

## 2020-04-29 DIAGNOSIS — J849 Interstitial pulmonary disease, unspecified: Secondary | ICD-10-CM

## 2020-04-29 NOTE — Progress Notes (Signed)
Daily Session Note  Patient Details  Name: Steven Pitts MRN: [id number] Date of Birth: 04/10/1946 Referring Provider:     Pulmonary Rehab Walk Test from 04/14/2020 in [place]  Referring Provider Brand Males      Encounter Date: 04/29/2020  Check In:  Session Check In - 04/29/20 1000      Check-In   Supervising physician immediately available to respond to emergencies [facility] immediately available    Physician(s) Dr. Lupita Dawn    [facility]    Staff Present Hoy Register, MS, CEP, Exercise Physiologist;Terrian Sentell Jani Gravel, MS, ACSM-CEP, Exercise Physiologist    Virtual Visit No    Medication changes reported     No    Fall or balance concerns reported    No    Tobacco Cessation No Change    Warm-up and Cool-down Performed on first and last piece of equipment    Resistance Training Performed Yes    VAD Patient? No    PAD/SET Patient? No      Pain Assessment   Currently in Pain? No/denies    Multiple Pain Sites No           Capillary Blood Glucose: No results found for this or any previous visit (from the past 24 hour(s)).    Social History   Tobacco Use  Smoking Status Former Smoker  . Types: Cigarettes  . Quit date: 12/17/1988  . Years since quitting: 31.3  Smokeless Tobacco Never Used  Tobacco Comment   quit 1980    Goals Met: No c/o of any cardiac symptoms Warm up and stretching exercises completed  Goals Unmet:  O2 Sat  Comments: Service time is from 1005 to 1115 Pt's oxygen saturation dropped to 85% on the nustep on O2 6L. O2 increased to 8 L, saturation improved to 90%. Will notify Dr. Golden Pop office of the saturations.   Dr. Fransico Him is Medical Director for Cardiac Rehab at [facility].

## 2020-04-29 NOTE — Progress Notes (Signed)
Today at Pulmonary Rehab Steven Pitts was doing a seated exercise on O2 at 6 liters. His saturation dropped to 85% and required his oxygen to be increased to 8 liters to get his saturation back up to 90%. Steven Pitts does not have a oxygen company that maintain oxygen for him. All that he has is a pulsed POC that goes to 5 liters. He does have a concentrator at home that only goes to 5 liters. He understands that he is going to have to give up his POC to get the amount of oxygen that is going to meet his oxygen needs. He is is fine with that. I notified Dr. Golden Pop office. Steven Pitts would like to use [employer].

## 2020-04-29 NOTE — Telephone Encounter (Signed)
Spoke with pt how stated he does have O2 concentrator and POC. Pt was informed he would have to come in for a OV to do a qualifying walk. Pt states understanding. Appoint was made with BW at 12pm on 05/07/20. Nothing further needed at this time.

## 2020-05-04 ENCOUNTER — Encounter (HOSPITAL_COMMUNITY)
Admission: RE | Admit: 2020-05-04 | Discharge: 2020-05-04 | Disposition: A | Discharge status: Home / Self Care | Payer: Medicare PPO | Source: Ambulatory Visit | Attending: Internal Medicine | Admitting: Internal Medicine

## 2020-05-04 ENCOUNTER — Other Ambulatory Visit: Payer: Self-pay

## 2020-05-04 VITALS — Wt 206.1 lb

## 2020-05-04 DIAGNOSIS — J849 Interstitial pulmonary disease, unspecified: Secondary | ICD-10-CM | POA: Diagnosis not present

## 2020-05-04 NOTE — Progress Notes (Signed)
Daily Session Note  Patient Details  Name: Steven Pitts MRN: [id number] Date of Birth: 06/29/1946 Referring Provider:     Pulmonary Rehab Walk Test from 04/14/2020 in [facility]  Referring Provider Ramaswamy, Murali      Encounter Date: 05/04/2020  Check In:  Session Check In - 05/04/20 1016      Check-In   Supervising physician immediately available to respond to emergencies [facility] immediately available    Physician(s) Dr. S Amery    [facility]    Staff Present Carlette Carlton, RN, BSN;Dalton Fletcher, MS, CEP, Exercise Physiologist;Lisa Hughes, RN;Jessica Martin, MS, ACSM-CEP, Exercise Physiologist    Virtual Visit No    Medication changes reported     No    Fall or balance concerns reported    No    Tobacco Cessation No Change    Warm-up and Cool-down Performed on first and last piece of equipment    Resistance Training Performed Yes    VAD Patient? No    PAD/SET Patient? No      Pain Assessment   Currently in Pain? No/denies    Multiple Pain Sites No           Capillary Blood Glucose: No results found for this or any previous visit (from the past 24 hour(s)).  POCT Glucose - 05/04/20 1130      POCT Blood Glucose   Pre-Exercise #2 110 mg/dL    Post-Exercise #2 144 mg/dL           Exercise Prescription Changes - 05/04/20 1100      Response to Exercise   Blood Pressure (Admit) 104/64    Blood Pressure (Exercise) 126/60    Blood Pressure (Exit) 112/60    Heart Rate (Admit) 71 bpm    Heart Rate (Exercise) 76 bpm    Heart Rate (Exit) 70 bpm    Oxygen Saturation (Admit) 95 %    Oxygen Saturation (Exercise) 89 %    Oxygen Saturation (Exit) 91 %    Rating of Perceived Exertion (Exercise) 11    Perceived Dyspnea (Exercise) 1    Duration Continue with 30 min of aerobic exercise without signs/symptoms of physical distress.    Intensity THRR unchanged      Progression   Progression  Continue to progress workloads to maintain intensity without signs/symptoms of physical distress.      Resistance Training   Training Prescription Yes    Weight blue bands    Reps 10-15    Time 10 Minutes      Oxygen   Oxygen Continuous    Liters 6      NuStep   Level 2    SPM 80    Minutes 30    METs 2.3           Social History   Tobacco Use  Smoking Status Former Smoker  . Types: Cigarettes  . Quit date: 12/17/1988  . Years since quitting: 31.4  Smokeless Tobacco Never Used  Tobacco Comment   quit 1980    Goals Met:  Independence with exercise equipment Exercise tolerated well No report of cardiac concerns or symptoms Strength training completed today  Goals Unmet:  Not Applicable  Comments: Service time is from 1000 to 1105    Dr. Traci Turner is Medical Director for Cardiac Rehab at  Hospital. 

## 2020-05-05 NOTE — Progress Notes (Signed)
Pulmonary Individual Treatment Plan  Patient Details  Name: Steven Pitts MRN: [id number] Date of Birth: 1945-12-01 Referring Provider:     Pulmonary Rehab Walk Test from 04/14/2020 in [facility]  Referring Provider Brand Males      Initial Encounter Date:    Pulmonary Rehab Walk Test from 04/14/2020 in [facility]  Date 04/14/20      Visit Diagnosis: Interstitial lung disease ([place])  Patient's Home Medications on Admission:   Current Outpatient Medications:  .  albuterol (VENTOLIN HFA) 108 (90 Base) MCG/ACT inhaler, INHALE 2 PUFFS EVERY 4-6 HOURS AS NEEDED FOR COUGH/SHORTNESS OF BREATH, Disp: , Rfl:  .  Armodafinil 150 MG tablet, Take 150 mg by mouth every morning., Disp: , Rfl:  .  aspirin EC 81 MG tablet, Take 1 tablet (81 mg total) by mouth daily., Disp: 90 tablet, Rfl: 3 .  atorvastatin (LIPITOR) 20 MG tablet, Take 20 mg by mouth daily., Disp: , Rfl:  .  carbidopa-levodopa (SINEMET IR) 25-100 MG tablet, TAKE 1 TABLET BY MOUTH 5 TIMES PER DAY, Disp: 150 tablet, Rfl: 5 .  DULoxetine (CYMBALTA) 60 MG capsule, Take 60 mg by mouth daily., Disp: , Rfl:  .  empagliflozin (JARDIANCE) 25 MG TABS tablet, Take 25 mg by mouth daily., Disp: , Rfl:  .  furosemide (LASIX) 20 MG tablet, TAKE 1 TABLET BY MOUTH DAILY, Disp: 90 tablet, Rfl: 1 .  gabapentin (NEURONTIN) 300 MG capsule, Take by mouth., Disp: , Rfl:  .  HYDROcodone-acetaminophen (NORCO) 10-325 MG per tablet, Take 10-325 tablets by mouth daily., Disp: , Rfl:  .  insulin glargine (LANTUS) 100 UNIT/ML injection, Inject 30 Units into the skin daily., Disp: , Rfl:  .  insulin lispro (HUMALOG) 100 UNIT/ML KwikPen, INJECT 14 UNITS AT BREAKFAST AND 10 UNITS AT LUNCH AND SUPPER UNDER THE SKIN AS DIRECTED, Disp: , Rfl:  .  metFORMIN (GLUCOPHAGE) 1000 MG tablet, Take 1,000 mg by mouth daily., Disp: , Rfl:  .  NEXIUM 40 MG capsule, Take 40 mg by mouth daily., Disp: , Rfl:  .   NOVOFINE PLUS 32G X 4 MM MISC, USE WITH TOUJEO AND HUMALOG PEND FOUR TIMES DAILY, Disp: , Rfl:  .  propranolol (INDERAL) 40 MG tablet, Take 40 mg by mouth 2 (two) times daily. , Disp: , Rfl:  .  Ropinirole HCl 12 MG TB24, TAKE ONE (1) TABLET BY MOUTH EACH DAY, Disp: 90 tablet, Rfl: 1 .  telmisartan-hydrochlorothiazide (MICARDIS HCT) 80-25 MG per tablet, Take 1 tablet by mouth daily. , Disp: , Rfl:   Past Medical History: Past Medical History:  Diagnosis Date  . Abnormality of gait   . Anxiety   . Diabetes ([place])   . Essential and other specified forms of tremor   . High blood pressure   . Other speech disturbance(784.59)   . Parkinson's disease ([place])     Tobacco Use: Social History   Tobacco Use  Smoking Status Former Smoker  . Types: Cigarettes  . Quit date: 12/17/1988  . Years since quitting: 31.4  Smokeless Tobacco Never Used  Tobacco Comment   quit 1980    Labs: Recent Review Flowsheet Data   There is no flowsheet data to display.     Capillary Blood Glucose: Lab Results  Component Value Date   GLUCAP 117 (H) 08/24/2008   GLUCAP 116 (H) 08/24/2008    POCT Glucose    Row Name 04/20/20 1210 05/04/20 1130  POCT Blood Glucose   Pre-Exercise 111 mg/dL --      Post-Exercise 169 mg/dL --      Pre-Exercise #2 -- 110 mg/dL      Post-Exercise #2 -- 144 mg/dL             Pulmonary Assessment Scores:  Pulmonary Assessment Scores    Row Name 04/14/20 1012 04/20/20 1517       ADL UCSD   ADL Phase Entry Entry    SOB Score total -- 58      CAT Score   CAT Score -- 19      mMRC Score   mMRC Score 2 --          UCSD: Self-administered rating of dyspnea associated with activities of daily living (ADLs) 6-point scale (0 = "not at all" to 5 = "maximal or unable to do because of breathlessness")  Scoring Scores range from 0 to 120.  Minimally important difference is 5 units  CAT: CAT can identify the health impairment of COPD patients and is  better correlated with disease progression.  CAT has a scoring range of zero to 40. The CAT score is classified into four groups of low (less than 10), medium (10 - 20), high (21-30) and very high (31-40) based on the impact level of disease on health status. A CAT score over 10 suggests significant symptoms.  A worsening CAT score could be explained by an exacerbation, poor medication adherence, poor inhaler technique, or progression of COPD or comorbid conditions.  CAT MCID is 2 points  mMRC: mMRC (Modified Medical Research Council) Dyspnea Scale is used to assess the degree of baseline functional disability in patients of respiratory disease due to dyspnea. No minimal important difference is established. A decrease in score of 1 point or greater is considered a positive change.   Pulmonary Function Assessment:  Pulmonary Function Assessment - 04/14/20 0923      Breath   Bilateral Breath Sounds Clear    Shortness of Breath Limiting activity;Fear of Shortness of Breath;Yes           Exercise Target Goals: Exercise Program Goal: Individual exercise prescription set using results from initial 6 min walk test and THRR while considering  patient's activity barriers and safety.   Exercise Prescription Goal: Initial exercise prescription builds to 30-45 minutes a day of aerobic activity, 2-3 days per week.  Home exercise guidelines will be given to patient during program as part of exercise prescription that the participant will acknowledge.  Activity Barriers & Risk Stratification:  Activity Barriers & Cardiac Risk Stratification - 04/14/20 0917      Activity Barriers & Cardiac Risk Stratification   Activity Barriers Neck/Spine Problems;Arthritis;Back Problems;Deconditioning;Muscular Weakness;Shortness of Breath;History of Falls;Assistive Device;Balance Concerns           6 Minute Walk:  6 Minute Walk    Row Name 04/14/20 1012         6 Minute Walk   Phase Initial     Distance  592 feet     Walk Time 6 minutes     # of Rest Breaks 1  took break to readjust SpO2 monitor on fingers. Aprrox. 30 seconds     MPH 1.12     METS 1.17     RPE 13     Perceived Dyspnea  1     VO2 Peak 4.09     Symptoms Yes (comment)     Comments SOB PPD = 1. No other complaints.  Resting HR 69 bpm     Resting BP 112/64     Resting Oxygen Saturation  95 %     Exercise Oxygen Saturation  during 6 min walk 84 %     Max Ex. HR 79 bpm     Max Ex. BP 116/70     2 Minute Post BP 110/70       Interval HR   1 Minute HR 77     2 Minute HR 79     3 Minute HR 77     4 Minute HR 76     5 Minute HR 77     6 Minute HR 86     2 Minute Post HR 67     Interval Heart Rate? Yes       Interval Oxygen   Interval Oxygen? Yes     Baseline Oxygen Saturation % 95 %     1 Minute Oxygen Saturation % 89 %     1 Minute Liters of Oxygen 3 L     2 Minute Oxygen Saturation % 85 %  Increased to 4L/min at min 2     2 Minute Liters of Oxygen 4 L     3 Minute Oxygen Saturation % 84 %     3 Minute Liters of Oxygen 4 L     4 Minute Oxygen Saturation % 87 %     4 Minute Liters of Oxygen 6 L     5 Minute Oxygen Saturation % 92 %     5 Minute Liters of Oxygen 6 L     6 Minute Oxygen Saturation % 90 %     6 Minute Liters of Oxygen 6 L     2 Minute Post Oxygen Saturation % 95 %     2 Minute Post Liters of Oxygen 3 L            Oxygen Initial Assessment:  Oxygen Initial Assessment - 04/14/20 0922      Home Oxygen   Home Oxygen Device Home Concentrator;Portable Concentrator    Sleep Oxygen Prescription CPAP    Liters per minute 3    Home Exercise Oxygen Prescription Pulsed    Liters per minute 3    Home at Rest Exercise Oxygen Prescription Pulsed    Liters per minute 3    Compliance with Home Oxygen Use Yes      Initial 6 min Walk   Oxygen Used Continuous    Liters per minute 6      Program Oxygen Prescription   Program Oxygen Prescription Continuous    Liters per minute 4   3-4 L/min with  nustep     Intervention   Short Term Goals To learn and exhibit compliance with exercise, home and travel O2 prescription;To learn and understand importance of monitoring SPO2 with pulse oximeter and demonstrate accurate use of the pulse oximeter.;To learn and understand importance of maintaining oxygen saturations>88%;To learn and demonstrate proper pursed lip breathing techniques or other breathing techniques.;To learn and demonstrate proper use of respiratory medications    Long  Term Goals Exhibits compliance with exercise, home and travel O2 prescription;Verbalizes importance of monitoring SPO2 with pulse oximeter and return demonstration;Maintenance of O2 saturations>88%;Exhibits proper breathing techniques, such as pursed lip breathing or other method taught during program session;Compliance with respiratory medication;Demonstrates proper use of MDI's           Oxygen Re-Evaluation:  Oxygen Re-Evaluation    Row Name 05/04/20 1140 05/05/20  1031           Program Oxygen Prescription   Program Oxygen Prescription Continuous;E-Tanks --      Liters per minute 6 --        Home Oxygen   Home Oxygen Device Home Concentrator;Portable Concentrator --      Sleep Oxygen Prescription CPAP --      Liters per minute 3 --      Home Exercise Oxygen Prescription Pulsed --      Liters per minute 5 --      Home at Rest Exercise Oxygen Prescription Pulsed --      Liters per minute 5 --      Compliance with Home Oxygen Use Yes --        Goals/Expected Outcomes   Short Term Goals To learn and exhibit compliance with exercise, home and travel O2 prescription;To learn and understand importance of monitoring SPO2 with pulse oximeter and demonstrate accurate use of the pulse oximeter.;To learn and understand importance of maintaining oxygen saturations>88%;To learn and demonstrate proper pursed lip breathing techniques or other breathing techniques.;To learn and demonstrate proper use of respiratory  medications --      Long  Term Goals Exhibits compliance with exercise, home and travel O2 prescription;Verbalizes importance of monitoring SPO2 with pulse oximeter and return demonstration;Maintenance of O2 saturations>88%;Exhibits proper breathing techniques, such as pursed lip breathing or other method taught during program session;Compliance with respiratory medication;Demonstrates proper use of MDI's --      Comments We have discussed with pt about switching from a POC to E tanks. He currently uses 5L pulsed on his POC, which is not enough when he is active and exercising. He arrives to rehab with his SpO2 in the low 80's. He is thinking about switching to etanks so he can receive 6L of continuous flow. We have discussed with pt about switching from a POC to E tanks. He currently uses 5L pulsed on his POC, which is not enough when he is active and exercising. He arrives to rehab with his SpO2 in the low 80's. He is thinking about switching to etanks so he can receive 6L of continuous flow. Pt has upcoming walk test mon 8/13 to complete for eligibility for home oxygen for [employer].      Goals/Expected Outcomes compliance compliance             Oxygen Discharge (Final Oxygen Re-Evaluation):  Oxygen Re-Evaluation - 05/05/20 1031      Goals/Expected Outcomes   Comments We have discussed with pt about switching from a POC to E tanks. He currently uses 5L pulsed on his POC, which is not enough when he is active and exercising. He arrives to rehab with his SpO2 in the low 80's. He is thinking about switching to etanks so he can receive 6L of continuous flow. Pt has upcoming walk test mon 8/13 to complete for eligibility for home oxygen for [employer].    Goals/Expected Outcomes compliance           Initial Exercise Prescription:  Initial Exercise Prescription - 04/14/20 1000      Date of Initial Exercise RX and Referring Provider   Date 04/14/20    Referring Provider Brand Males     Expected Discharge Date 06/17/20      Oxygen   Oxygen Continuous    Liters 4      NuStep   Level 1    SPM 65    Minutes 30  METs 1.6      Prescription Details   Frequency (times per week) 2    Duration Progress to 30 minutes of continuous aerobic without signs/symptoms of physical distress      Intensity   THRR 40-80% of Max Heartrate 60-119    Ratings of Perceived Exertion 11-13    Perceived Dyspnea 0-4      Progression   Progression Continue progressive overload as per policy without signs/symptoms or physical distress.      Resistance Training   Training Prescription Yes    Weight Theraband    Reps 10-15           Perform Capillary Blood Glucose checks as needed.  Exercise Prescription Changes:  Exercise Prescription Changes    Row Name 04/20/20 1200 04/27/20 1100 05/04/20 1100         Response to Exercise   Blood Pressure (Admit) 104/60 -- 104/64     Blood Pressure (Exercise) 122/70 -- 126/60     Blood Pressure (Exit) 104/56 -- 112/60     Heart Rate (Admit) 61 bpm -- 71 bpm     Heart Rate (Exercise) 73 bpm -- 76 bpm     Heart Rate (Exit) 71 bpm -- 70 bpm     Oxygen Saturation (Admit) 97 % -- 95 %     Oxygen Saturation (Exercise) 92 % -- 89 %     Oxygen Saturation (Exit) 96 % -- 91 %     Rating of Perceived Exertion (Exercise) 9 -- 11     Perceived Dyspnea (Exercise) 1 -- 1     Duration Continue with 30 min of aerobic exercise without signs/symptoms of physical distress. -- Continue with 30 min of aerobic exercise without signs/symptoms of physical distress.     Intensity THRR unchanged -- THRR unchanged       Progression   Progression Continue to progress workloads to maintain intensity without signs/symptoms of physical distress. -- Continue to progress workloads to maintain intensity without signs/symptoms of physical distress.       Resistance Training   Training Prescription Yes -- Yes     Weight blue bands -- blue bands     Reps 10-15 -- 10-15      Time 10 Minutes -- 10 Minutes       Oxygen   Oxygen Continuous -- Continuous     Liters 6 -- 6       NuStep   Level 2 -- 2     SPM 80 -- 80     Minutes 30 -- 30     METs 2.1 -- 2.3       Home Exercise Plan   Plans to continue exercise at -- Home (comment) --     Frequency -- Add 2 additional days to program exercise sessions. --     Initial Home Exercises Provided -- 04/27/20 --            Exercise Comments:  Exercise Comments    Row Name 04/20/20 1212 04/27/20 1143         Exercise Comments Pt completed his first day of exercise in pulmonary rehab today. Pt tolerated exercise well with no complaints. home exercise reviewed             Exercise Goals and Review:  Exercise Goals    Row Name 04/14/20 1021 05/04/20 1142           Exercise Goals   Increase Physical Activity Yes Yes  Intervention Provide advice, education, support and counseling about physical activity/exercise needs.;Develop an individualized exercise prescription for aerobic and resistive training based on initial evaluation findings, risk stratification, comorbidities and participant's personal goals. Provide advice, education, support and counseling about physical activity/exercise needs.;Develop an individualized exercise prescription for aerobic and resistive training based on initial evaluation findings, risk stratification, comorbidities and participant's personal goals.      Expected Outcomes Short Term: Attend rehab on a regular basis to increase amount of physical activity.;Long Term: Add in home exercise to make exercise part of routine and to increase amount of physical activity.;Long Term: Exercising regularly at least 3-5 days a week. Short Term: Attend rehab on a regular basis to increase amount of physical activity.;Long Term: Add in home exercise to make exercise part of routine and to increase amount of physical activity.;Long Term: Exercising regularly at least 3-5 days a week.       Increase Strength and Stamina Yes Yes      Intervention Provide advice, education, support and counseling about physical activity/exercise needs.;Develop an individualized exercise prescription for aerobic and resistive training based on initial evaluation findings, risk stratification, comorbidities and participant's personal goals. Provide advice, education, support and counseling about physical activity/exercise needs.;Develop an individualized exercise prescription for aerobic and resistive training based on initial evaluation findings, risk stratification, comorbidities and participant's personal goals.      Expected Outcomes Short Term: Increase workloads from initial exercise prescription for resistance, speed, and METs.;Short Term: Perform resistance training exercises routinely during rehab and add in resistance training at home;Long Term: Improve cardiorespiratory fitness, muscular endurance and strength as measured by increased METs and functional capacity (6MWT) Short Term: Increase workloads from initial exercise prescription for resistance, speed, and METs.;Short Term: Perform resistance training exercises routinely during rehab and add in resistance training at home;Long Term: Improve cardiorespiratory fitness, muscular endurance and strength as measured by increased METs and functional capacity (6MWT)      Able to understand and use rate of perceived exertion (RPE) scale Yes Yes      Intervention Provide education and explanation on how to use RPE scale Provide education and explanation on how to use RPE scale      Expected Outcomes Short Term: Able to use RPE daily in rehab to express subjective intensity level;Long Term:  Able to use RPE to guide intensity level when exercising independently Short Term: Able to use RPE daily in rehab to express subjective intensity level;Long Term:  Able to use RPE to guide intensity level when exercising independently      Able to understand and use Dyspnea  scale Yes Yes      Intervention Provide education and explanation on how to use Dyspnea scale Provide education and explanation on how to use Dyspnea scale      Expected Outcomes Short Term: Able to use Dyspnea scale daily in rehab to express subjective sense of shortness of breath during exertion;Long Term: Able to use Dyspnea scale to guide intensity level when exercising independently Short Term: Able to use Dyspnea scale daily in rehab to express subjective sense of shortness of breath during exertion;Long Term: Able to use Dyspnea scale to guide intensity level when exercising independently      Knowledge and understanding of Target Heart Rate Range (THRR) Yes Yes      Intervention Provide education and explanation of THRR including how the numbers were predicted and where they are located for reference Provide education and explanation of THRR including how the numbers were  predicted and where they are located for reference      Expected Outcomes Short Term: Able to state/look up THRR;Long Term: Able to use THRR to govern intensity when exercising independently;Short Term: Able to use daily as guideline for intensity in rehab Short Term: Able to state/look up THRR;Long Term: Able to use THRR to govern intensity when exercising independently;Short Term: Able to use daily as guideline for intensity in rehab      Understanding of Exercise Prescription Yes Yes      Intervention Provide education, explanation, and written materials on patient's individual exercise prescription Provide education, explanation, and written materials on patient's individual exercise prescription      Expected Outcomes Short Term: Able to explain program exercise prescription;Long Term: Able to explain home exercise prescription to exercise independently Short Term: Able to explain program exercise prescription;Long Term: Able to explain home exercise prescription to exercise independently             Exercise Goals  Re-Evaluation :  Exercise Goals Re-Evaluation    Row Name 05/04/20 1143             Exercise Goal Re-Evaluation   Exercise Goals Review Increase Physical Activity;Increase Strength and Stamina;Able to understand and use rate of perceived exertion (RPE) scale;Able to understand and use Dyspnea scale;Knowledge and understanding of Target Heart Rate Range (THRR);Understanding of Exercise Prescription       Comments Pt has attended 5 exercise sessions. He has a positive outlook and is eager for workload increases. He has gotten a new home oxygen concentrator that goes up to 10 L, so he can exercise safely at home. His old one only went up to 5 L. He is also going to switch to e tanks instead of his POC, as his POC does not give him an adequate amount of liter flow. He is currently exercising at 2.3 METs on the stepper. Will continue to monitor and progress as able.       Expected Outcomes Through exercise at rehab and at home, the patient will decrease shortness of breath with daily activities and feel confident in carrying out an exercise regime at home.              Discharge Exercise Prescription (Final Exercise Prescription Changes):  Exercise Prescription Changes - 05/04/20 1100      Response to Exercise   Blood Pressure (Admit) 104/64    Blood Pressure (Exercise) 126/60    Blood Pressure (Exit) 112/60    Heart Rate (Admit) 71 bpm    Heart Rate (Exercise) 76 bpm    Heart Rate (Exit) 70 bpm    Oxygen Saturation (Admit) 95 %    Oxygen Saturation (Exercise) 89 %    Oxygen Saturation (Exit) 91 %    Rating of Perceived Exertion (Exercise) 11    Perceived Dyspnea (Exercise) 1    Duration Continue with 30 min of aerobic exercise without signs/symptoms of physical distress.    Intensity THRR unchanged      Progression   Progression Continue to progress workloads to maintain intensity without signs/symptoms of physical distress.      Resistance Training   Training Prescription Yes     Weight blue bands    Reps 10-15    Time 10 Minutes      Oxygen   Oxygen Continuous    Liters 6      NuStep   Level 2    SPM 80    Minutes 30  METs 2.3           Nutrition:  Target Goals: Understanding of nutrition guidelines, daily intake of sodium <1516m, cholesterol <2017m calories 30% from fat and 7% or less from saturated fats, daily to have 5 or more servings of fruits and vegetables.  Biometrics:  Pre Biometrics - 04/14/20 0922      Pre Biometrics   Triceps Skinfold 30 mm            Nutrition Therapy Plan and Nutrition Goals:  Nutrition Therapy & Goals - 05/04/20 1424      Nutrition Therapy   Diet Carb modified      Personal Nutrition Goals   Nutrition Goal Pt to build a healthy plate including vegetables, fruits, whole grains, and low-fat dairy products in a healthy meal plan.    Personal Goal #2 Pt to incorporate fiber rich foods daily to reach goal of minimum fiber intake 28 g/day      Intervention Plan   Intervention Nutrition handout(s) given to patient.;Prescribe, educate and counsel regarding individualized specific dietary modifications aiming towards targeted core components such as weight, hypertension, lipid management, diabetes, heart failure and other comorbidities.    Expected Outcomes Short Term Goal: A plan has been developed with personal nutrition goals set during dietitian appointment.;Long Term Goal: Adherence to prescribed nutrition plan.           Nutrition Assessments:  Nutrition Assessments - 05/04/20 1423      Rate Your Plate Scores   Pre Score 59           Nutrition Goals Re-Evaluation:  Nutrition Goals Re-Evaluation    RoGregoryame 05/04/20 1425             Goals   Current Weight 206 lb (93.4 kg)       Nutrition Goal Pt to build a healthy plate including vegetables, fruits, whole grains, and low-fat dairy products in a healthy meal plan.         Personal Goal #2 Re-Evaluation   Personal Goal #2 Pt to incorporate  fiber rich foods daily to reach goal of minimum fiber intake 28 g/day              Nutrition Goals Discharge (Final Nutrition Goals Re-Evaluation):  Nutrition Goals Re-Evaluation - 05/04/20 1425      Goals   Current Weight 206 lb (93.4 kg)    Nutrition Goal Pt to build a healthy plate including vegetables, fruits, whole grains, and low-fat dairy products in a healthy meal plan.      Personal Goal #2 Re-Evaluation   Personal Goal #2 Pt to incorporate fiber rich foods daily to reach goal of minimum fiber intake 28 g/day           Psychosocial: Target Goals: Acknowledge presence or absence of significant depression and/or stress, maximize coping skills, provide positive support system. Participant is able to verbalize types and ability to use techniques and skills needed for reducing stress and depression.  Initial Review & Psychosocial Screening:  Initial Psych Review & Screening - 04/14/20 [id number]    Initial Review   Current issues with Current Depression;History of Depression   is on cymbalta and it helps keep his depression stable,     Family Dynamics   Good Support System? Yes      Barriers   Psychosocial barriers to participate in program The patient should benefit from training in stress management and relaxation.      Screening Interventions  Interventions Encouraged to exercise    Expected Outcomes --   Has chronic illnesses, unable to perform activities he use to enjoy.          Quality of Life Scores:  Scores of 19 and below usually indicate a poorer quality of life in these areas.  A difference of  2-3 points is a clinically meaningful difference.  A difference of 2-3 points in the total score of the Quality of Life Index has been associated with significant improvement in overall quality of life, self-image, physical symptoms, and general health in studies assessing change in quality of life.   PHQ-9: Recent Review Flowsheet Data    Depression screen [facility]  2/9 04/14/2020   Decreased Interest 0   Down, Depressed, Hopeless 0   PHQ - 2 Score 0   Altered sleeping 0   Tired, decreased energy 0   Change in appetite 0   Feeling bad or failure about yourself  0   Trouble concentrating 1   Moving slowly or fidgety/restless 0   Suicidal thoughts 0   PHQ-9 Score 1   Difficult doing work/chores Not difficult at all     Interpretation of Total Score  Total Score Depression Severity:  1-4 = Minimal depression, 5-9 = Mild depression, 10-14 = Moderate depression, 15-19 = Moderately severe depression, 20-27 = Severe depression   Psychosocial Evaluation and Intervention:  Psychosocial Evaluation - 04/14/20 0927      Psychosocial Evaluation & Interventions   Interventions Stress management education;Relaxation education;Encouraged to exercise with the program and follow exercise prescription    Comments Depression is stable at this point    Continue Psychosocial Services  No Follow up required           Psychosocial Re-Evaluation:  Psychosocial Re-Evaluation    [place] Name 05/05/20 1034             Psychosocial Re-Evaluation   Current issues with History of Depression       Comments Pt with history of depression.  Pt admits to having occasional "blue" day but typically can "shake" it off. Pt feels he has good medical managment of his depression with  cymbalta 60 mg daily. Pt feels supported by his wife.       Expected Outcomes Pt will be free of any identifiable  Psychosocial barriers Pt will continue to display positive and healthy coping skills.       Interventions Stress management education;Encouraged to attend Pulmonary Rehabilitation for the exercise;Relaxation education       Continue Psychosocial Services  Follow up required by staff  Will periodically check in with pt for continued mental well being.              Psychosocial Discharge (Final Psychosocial Re-Evaluation):  Psychosocial Re-Evaluation - 05/05/20 1034       Psychosocial Re-Evaluation   Current issues with History of Depression    Comments Pt with history of depression.  Pt admits to having occasional "blue" day but typically can "shake" it off. Pt feels he has good medical managment of his depression with  cymbalta 60 mg daily. Pt feels supported by his wife.    Expected Outcomes Pt will be free of any identifiable  Psychosocial barriers Pt will continue to display positive and healthy coping skills.    Interventions Stress management education;Encouraged to attend Pulmonary Rehabilitation for the exercise;Relaxation education    Continue Psychosocial Services  Follow up required by staff   Will periodically check in with pt  for continued mental well being.           Education: Education Goals: Education classes will be provided on a weekly basis, covering required topics. Participant will state understanding/return demonstration of topics presented.  Learning Barriers/Preferences:  Learning Barriers/Preferences - 04/14/20 0927      Learning Barriers/Preferences   Learning Barriers --   has a major tremor, his wife assists him in filling out paperwork   Learning Preferences Computer/Internet           Education Topics: How Lungs Work and Diseases: - Discuss the anatomy of the lungs and diseases that can affect the lungs, such as COPD.   Exercise: -Discuss the importance of exercise, FITT principles of exercise, normal and abnormal responses to exercise, and how to exercise safely.   Environmental Irritants: -Discuss types of environmental irritants and how to limit exposure to environmental irritants.   Meds/Inhalers and oxygen: - Discuss respiratory medications, definition of an inhaler and oxygen, and the proper way to use an inhaler and oxygen.   Energy Saving Techniques: - Discuss methods to conserve energy and decrease shortness of breath when performing activities of daily living.    Bronchial Hygiene / Breathing  Techniques: - Discuss breathing mechanics, pursed-lip breathing technique,  proper posture, effective ways to clear airways, and other functional breathing techniques   Cleaning Equipment: - Provides group verbal and written instruction about the health risks of elevated stress, cause of high stress, and healthy ways to reduce stress.   Nutrition I: Fats: - Discuss the types of cholesterol, what cholesterol does to the body, and how cholesterol levels can be controlled.   Nutrition II: Labels: -Discuss the different components of food labels and how to read food labels.   Respiratory Infections: - Discuss the signs and symptoms of respiratory infections, ways to prevent respiratory infections, and the importance of seeking medical treatment when having a respiratory infection.   Stress I: Signs and Symptoms: - Discuss the causes of stress, how stress may lead to anxiety and depression, and ways to limit stress.   Stress II: Relaxation: -Discuss relaxation techniques to limit stress.   Oxygen for Home/Travel: - Discuss how to prepare for travel when on oxygen and proper ways to transport and store oxygen to ensure safety.   Knowledge Questionnaire Score:  Knowledge Questionnaire Score - 04/20/20 1518      Knowledge Questionnaire Score   Pre Score 16/18           Core Components/Risk Factors/Patient Goals at Admission:  Personal Goals and Risk Factors at Admission - 04/14/20 0929      Core Components/Risk Factors/Patient Goals on Admission   Improve shortness of breath with ADL's Yes    Intervention Provide education, individualized exercise plan and daily activity instruction to help decrease symptoms of SOB with activities of daily living.    Expected Outcomes Short Term: Improve cardiorespiratory fitness to achieve a reduction of symptoms when performing ADLs;Long Term: Be able to perform more ADLs without symptoms or delay the onset of symptoms           Core  Components/Risk Factors/Patient Goals Review:   Goals and Risk Factor Review    Row Name 04/14/20 0929 05/05/20 1042           Core Components/Risk Factors/Patient Goals Review   Personal Goals Review Increase knowledge of respiratory medications and ability to use respiratory devices properly.;Improve shortness of breath with ADL's;Develop more efficient breathing techniques such as purse lipped breathing and  diaphragmatic breathing and practicing self-pacing with activity. Increase knowledge of respiratory medications and ability to use respiratory devices properly.;Improve shortness of breath with ADL's;Develop more efficient breathing techniques such as purse lipped breathing and diaphragmatic breathing and practicing self-pacing with activity.;Diabetes      Review -- Pt has completed 5 exercsie sessions. Pt reports that he is able shop in large stores with the assistance of shopping cart with less shortness of breath. Pt diabetes is well mangaged with current medicine regimen, diet and effort to increase his activity.      Expected Outcomes -- Pt will demonstarte efficient breathing techniques such as PLB and diaphragmatic breahing will pacing himself during activity.  Pt will continue to maintain appropriate managment of his diabetes by compliance with medication, adherence to nutritional reccomendation and identify opportunites to increase his activity             Core Components/Risk Factors/Patient Goals at Discharge (Final Review):   Goals and Risk Factor Review - 05/05/20 1042      Core Components/Risk Factors/Patient Goals Review   Personal Goals Review Increase knowledge of respiratory medications and ability to use respiratory devices properly.;Improve shortness of breath with ADL's;Develop more efficient breathing techniques such as purse lipped breathing and diaphragmatic breathing and practicing self-pacing with activity.;Diabetes    Review Pt has completed 5 exercsie sessions.  Pt reports that he is able shop in large stores with the assistance of shopping cart with less shortness of breath. Pt diabetes is well mangaged with current medicine regimen, diet and effort to increase his activity.    Expected Outcomes Pt will demonstarte efficient breathing techniques such as PLB and diaphragmatic breahing will pacing himself during activity.  Pt will continue to maintain appropriate managment of his diabetes by compliance with medication, adherence to nutritional reccomendation and identify opportunites to increase his activity           ITP Comments:  ITP Comments    Row Name 05/05/20 1026           ITP Comments Dr. Rodman Pickle, Medical Directo for [facility]              Comments:   [name] has completed  5exercise session in Pulmonary rehab. Pt maintains good attendance and makes attempts to increase his activity during his off days from Pulmonary Rehab. Pulmonary rehab staff will  continue to monitor and reassess progress toward goals during her participation in Pulmonary Rehab. Cherre Huger, BSN Cardiac and Training and development officer

## 2020-05-06 ENCOUNTER — Other Ambulatory Visit: Payer: Self-pay

## 2020-05-06 ENCOUNTER — Encounter (HOSPITAL_COMMUNITY)
Admission: RE | Admit: 2020-05-06 | Discharge: 2020-05-06 | Disposition: A | Discharge status: Home / Self Care | Payer: Medicare PPO | Source: Ambulatory Visit | Attending: Internal Medicine | Admitting: Internal Medicine

## 2020-05-06 DIAGNOSIS — J849 Interstitial pulmonary disease, unspecified: Secondary | ICD-10-CM | POA: Diagnosis not present

## 2020-05-06 NOTE — Progress Notes (Signed)
Daily Session Note  Patient Details  Name: DESHUN SEDIVY MRN: [id number] Date of Birth: 11/06/1945 Referring Provider:     Pulmonary Rehab Walk Test from 04/14/2020 in [place]  Referring Provider Brand Males      Encounter Date: 05/06/2020  Check In:  Session Check In - 05/06/20 1018      Check-In   Supervising physician immediately available to respond to emergencies [facility] immediately available    Physician(s) Dr. Claybon Jabs    Location [facility]    Staff Present Maurice Small, RN, Bjorn Loser, MS, CEP, Exercise Physiologist;Lisa Jani Gravel, MS, ACSM-CEP, Exercise Physiologist    Virtual Visit No    Medication changes reported     No    Fall or balance concerns reported    No    Tobacco Cessation No Change    Warm-up and Cool-down Performed on first and last piece of equipment    Resistance Training Performed Yes    VAD Patient? No    PAD/SET Patient? No      Pain Assessment   Currently in Pain? No/denies    Multiple Pain Sites No           Capillary Blood Glucose: No results found for this or any previous visit (from the past 24 hour(s)).    Social History   Tobacco Use  Smoking Status Former Smoker  . Types: Cigarettes  . Quit date: 12/17/1988  . Years since quitting: 31.4  Smokeless Tobacco Never Used  Tobacco Comment   quit 1980    Goals Met:  Proper associated with RPD/PD & O2 Sat Exercise tolerated well No report of cardiac concerns or symptoms Strength training completed today  Goals Unmet:  Not Applicable  Comments: Service time is from 1005 to 1115    Dr. Fransico Him is Medical Director for Cardiac Rehab at [facility].

## 2020-05-07 ENCOUNTER — Encounter: Payer: Self-pay | Admitting: Primary Care

## 2020-05-07 ENCOUNTER — Ambulatory Visit (INDEPENDENT_AMBULATORY_CARE_PROVIDER_SITE_OTHER): Payer: Medicare PPO | Admitting: Primary Care

## 2020-05-07 DIAGNOSIS — J849 Interstitial pulmonary disease, unspecified: Secondary | ICD-10-CM

## 2020-05-07 DIAGNOSIS — J9611 Chronic respiratory failure with hypoxia: Secondary | ICD-10-CM | POA: Diagnosis not present

## 2020-05-07 NOTE — Assessment & Plan Note (Signed)
-  Increased Symptom burden and oxygen demand over last three months. Patient is attending pulmonary rehab. ILD score is 23 today (11). Serology positive for ANA, sed rate 39. RF negative. Unsure if patient would be candidate for bronchoscopy bx d/t oxygen requirements and lung function. Patient has follow-up next week with Dr. Alveria Apley

## 2020-05-07 NOTE — Assessment & Plan Note (Signed)
-  Patient requiring 5L oxygen on exertion to maintain O2 >90%. He is needing 6.5L during pulmonary rehab. Patient re-qualified for oxygen today. Uses [employer]. Needs larger tanks/concentrator to accommodate higher flow.

## 2020-05-07 NOTE — Patient Instructions (Addendum)
Pleasure meeting you today Mr. Strawderman  Recommendations: No changes today Continue 5 L oxygen on exertion and 3L at bedtime   Orders: Re-qualified for oxygen (needs larger tanks)  Follow-up: Next with with Dr. Chase Caller as scheduled    Pulmonary Fibrosis  Pulmonary fibrosis is a type of lung disease that causes scarring. Over time, the scar tissue builds up in the air sacs of your lungs (alveoli). This makes it hard for you to breathe. Less oxygen can get into your blood. Scarring from pulmonary fibrosis gets worse over time. This damage is permanent and may lead to other serious health problems. What are the causes? There are many different causes of pulmonary fibrosis. Sometimes the cause is not known. This is called idiopathic pulmonary fibrosis. Other causes include:  Exposure to chemicals and substances found in agricultural, farm, Architect, or factory work. These include mold, asbestos, silica, metal dusts, and toxic fumes.  Sarcoidosis. In this disease, areas of inflammatory cells (granulomas) form and most often affect the lungs.  Autoimmune diseases. These include diseases such as rheumatoid arthritis, systemic sclerosis, or connective tissue disease.  Taking certain medicines. These include drugs used in radiation therapy or used to treat seizures, heart problems, and some infections. What increases the risk? You are more likely to develop this condition if:  You have a family history of the disease.  You are older. The condition is more common in older adults.  You have a history of smoking.  You have a job that exposes you to certain chemicals.  You have gastroesophageal reflux disease (GERD). What are the signs or symptoms? Symptoms of this condition include:  Difficulty breathing that gets worse with activity.  Shortness of breath (dyspnea).  Dry, hacking cough.  Rapid, shallow breathing during exercise or while at rest.  Bluish skin and  lips.  Loss of appetite.  Weakness.  Weight loss and fatigue.  Rounded and enlarged fingertips (clubbing). How is this diagnosed? This condition may be diagnosed based on:  Your symptoms and medical history.  A physical exam. You may also have tests, including:  A test that involves looking inside your lungs with an instrument (bronchoscopy).  Imaging studies of your lungs and heart.  Tests to measure how well you are breathing (pulmonary function tests).  Blood tests.  Tests to see how well your lungs work while you are walking (pulmonary stress test).  A procedure to remove a lung tissue sample to look at it under a microscope (biopsy). How is this treated? There is no cure for pulmonary fibrosis. Treatment focuses on managing symptoms and preventing scarring from getting worse. This may include:  Medicines, such as: ? Steroids to prevent permanent lung changes. ? Medicines to suppress your body's defense system (immune system). ? Medicines to help with lung function by reducing inflammation or scarring.  Ongoing monitoring with X-rays and lab work.  Oxygen therapy.  Pulmonary rehabilitation.  Surgery. In some cases, a lung transplant is possible. Follow these instructions at home:     Medicines  Take over-the-counter and prescription medicines only as told by your health care provider.  Keep your vaccinations up to date as recommended by your health care provider. General instructions  Do not use any products that contain nicotine or tobacco, such as cigarettes and e-cigarettes. If you need help quitting, ask your health care provider.  Get regular exercise, but do not overexert yourself. Ask your health care provider to suggest some activities that are safe for you to do. ?  If you have physical limitations, you may get exercise by walking, using a stationary bike, or doing chair exercises. ? Ask your health care provider about using oxygen while  exercising.  If you are exposed to chemicals and substances at work, make sure that you wear a mask or respirator at all times.  Join a pulmonary rehabilitation program or a support group for people with pulmonary fibrosis.  Eat small meals often so you do not get too full. Overeating can make breathing trouble worse.  Maintain a healthy weight. Lose weight if you need to.  Do breathing exercises as directed by your health care provider.  Keep all follow-up visits as told by your health care provider. This is important. Contact a health care provider if you:  Have symptoms that do not get better with medicines.  Are not able to be as active as usual.  Have trouble taking a deep breath.  Have a fever or chills.  Have blue lips or skin.  Have clubbing of your fingers. Get help right away if you:  Have a sudden worsening of your symptoms.  Have chest pain.  Cough up mucus that is dark in color.  Have a lot of headaches.  Get very confused or sleepy. Summary  Pulmonary fibrosis is a type of lung disease that causes scar tissue to build up in the air sacs of your lungs (alveoli) over time. Less oxygen can get into your blood. This makes it hard for you to breathe.  Scarring from pulmonary fibrosis gets worse over time. This damage is permanent and may lead to other serious health problems.  You are more likely to develop this condition if you have a family history of the condition or a job that exposes you to certain chemicals.  There is no cure for pulmonary fibrosis. Treatment focuses on managing symptoms and preventing scarring from getting worse. This information is not intended to replace advice given to you by your health care provider. Make sure you discuss any questions you have with your health care provider. Document Revised: 10/17/2017 Document Reviewed: 10/17/2017 Elsevier Patient Education  2020 [employer].

## 2020-05-07 NOTE — Addendum Note (Signed)
Addended by: Satira Sark D on: 05/07/2020 01:55 PM   Modules accepted: Orders

## 2020-05-07 NOTE — Progress Notes (Signed)
_0  ID: Steven Pitts, male    DOB: 1946-01-09, 74 y.o.   MRN: [id number]  Chief Complaint  Patient presents with   Follow-up    pt needs to requalify for oxygen    Referring provider: Leanna Battles, MD  HPI: 74 year old male, former smoker.  Past medical history significant for dyspnea, interstitial lung disease, chronic respiratory failure with hypoxia, obstructive sleep apnea, Parkinson's disease, diabetes, high blood pressure.  Patient of Dr. Chase Caller, patient last seen in office by pulmonary nurse practitioner in 03/19/2020. Patient is a former [employer] was exposed to agent orange.  Previous [facility] pulmonary encounters: 02/19/20- Dr. Chase Caller, [name] 74 y.o. -has been referred by Dr. Einar Gip cardiologist for evaluation of dyspnea and concern for interstitial lung disease.  According to the patient he has had insidious onset of dyspnea and cough for the last 3 months.  It is progressive.  In fact today when he was roomed in his pulse ox was found to be 84% when he walked in and sat down.  This is a new onset dyspnea.  He has never had this problem before.  There is no wheezing there is no orthopnea there is no proximal nocturnal dyspnea.  He says his cardiac issues have been cleared by Dr. Einar Gip.  Review of the records indicate that patient has hypertension, diabetes, hyperlipidemia obstructive sleep apnea on CPAP and restless leg syndrome.  He did have a chest x-ray March  2021 which I visualized that was concerning for pneumonia according to the notes but to me looks like interstitial lung disease.  It appears he was initially started on Lasix by the primary care team and this helped an improvement but not resolution.  He had echo end of March 2021 that shows grade 1 diastolic dysfunction with ejection fraction 55-60%.  Dr. Einar Gip felt patient was euvolemic after diuresis but still has residual basal crackles [I also auscultate this] and therefore has been referred here  to the ILD center.  History and features are very suspicious for interstitial lung disease given his age, male gender, Caucasian ethnicity, occupational work history plus home exposures involving hot dog and bird feather pillow.  Presence of interstitial infiltrates on the chest x-ray and desaturations added to the suspicion.  Most likely diagnosis here is IPF versus NSIP versus hypersensitive pneumonitis.  He needs a full work-up.  6/25/21[name], NP 74 year old male former smoker (quit 1990) seen for pulmonary consult Feb 19, 2020 for progressive dyspnea since March 2021 and possible ILD  Patient returns for a 1 month follow-up.  Patient was seen last visit for pulmonary consult for shortness of breath since March 2021.  Patient complains over the last 3 months he has been getting progressively worse with shortness of breath with minimum activity, decreased activity tolerance.  Prior to this past year he was able to mow his grass he says he is not able to do that.  He is able to stand still without any significant shortness of breath.  He does do woodworking and feels that he is okay to do this.  He has now been started on oxygen due to desaturations with activities.  He is currently on oxygen 2 L at rest and 3 L with activity.  Patient says he does have a dry cough.  He denies any hemoptysis chest pain orthopnea or PND.  Patient was set up for pulmonary function testing that was done  on February 24, 2020 showed moderate restriction with an FEV1  at 77%, ratio 78, FVC 72%, no significant bronchodilator response, severe diffusing defect with a DLCO at 37%. We discussed increasing his physical reconditioning with pulmonary rehab which he is in agreement.    05/07/2020-Interim history Patient presents today to re-qualify for oxygen. During last visit he was referred to pulmonary rehab, ordered for serology labs and ONO. Autoimmune labs positive for ANA, sed rate 39. Rheumatoid factor negative. Pulmonary  function testing on 02/24/20 showed moderate restriction with no BD response and severe diffusion defect at 37%. He reports moderate-severe dyspnea, fatigue and occasional cough. He has had neck, lower back and should pain for 10+ years. He has started pulmonary rehab, he is attending twice a week. He normally wears 3L oxygen at rest, 5L on exertion during the day and wears 3L at night. He has been needing to use 6-6.5L during pulmonary rehab. Today on walk he needed 5L to maintain O2 >90%. He is scheduled for follow-up with Dr. Chase Caller next week.    SYMPTOM SCALE - ILD 02/19/2020  05/07/2020   O2 use ra 3-5L; 6.5L with pulmonary rehab  Shortness of Breath 0 -> 5 scale with 5 being worst (score 6 If unable to do) 0 -> 5 scale with 5 being worst (score 6 If unable to do)  At rest 0 2  Simple tasks - showers, clothes change, eating, shaving 0 3  Household (dishes, doing bed, laundry) 3 4  Shopping 4 5  Walking level at own pace 2 4  Walking up Stairs 5 5  Total (30-36) Dyspnea Score 11 23  How bad is your cough? 2 1  How bad is your fatigue yes 5  How bad is nausea no 0  How bad is vomiting?  no 0  How bad is diarrhea? no 0  How bad is anxiety? x 3  How bad is depression x 4     TEST/EVENTS :  High-resolution CT chest March 06, 2020 showed a spectrum of findings compatible with basilar predominant fibrotic interstitial lung disease without frank honeycombing findings are characterized as probable UIP.  Allergies  Allergen Reactions   Gadolinium Derivatives Other (See Comments)    Patient sneezed immediately after administering gadolinium/ no treatment/ pt had no other reaction//jv    Immunization History  Administered Date(s) Administered   PFIZER SARS-COV-2 Vaccination 11/03/2019, 11/26/2019    Past Medical History:  Diagnosis Date   Abnormality of gait    Anxiety    Diabetes ([place])    Essential and other specified forms of tremor    High blood pressure    Other  speech disturbance(784.59)    Parkinson's disease ([place])     Tobacco History: Social History   Tobacco Use  Smoking Status Former Smoker   Types: Cigarettes   Quit date: 12/17/1988   Years since quitting: 31.4  Smokeless Tobacco Never Used  Tobacco Comment   quit 1980   Counseling given: Not Answered Comment: quit 1980   Outpatient Medications Prior to Visit  Medication Sig Dispense Refill   albuterol (VENTOLIN HFA) 108 (90 Base) MCG/ACT inhaler INHALE 2 PUFFS EVERY 4-6 HOURS AS NEEDED FOR COUGH/SHORTNESS OF BREATH     Armodafinil 150 MG tablet Take 150 mg by mouth every morning.     aspirin EC 81 MG tablet Take 1 tablet (81 mg total) by mouth daily. 90 tablet 3   atorvastatin (LIPITOR) 20 MG tablet Take 20 mg by mouth daily.     carbidopa-levodopa (SINEMET IR) 25-100 MG tablet TAKE 1  TABLET BY MOUTH 5 TIMES PER DAY 150 tablet 5   DULoxetine (CYMBALTA) 60 MG capsule Take 60 mg by mouth daily.     empagliflozin (JARDIANCE) 25 MG TABS tablet Take 25 mg by mouth daily.     furosemide (LASIX) 20 MG tablet TAKE 1 TABLET BY MOUTH DAILY 90 tablet 1   gabapentin (NEURONTIN) 300 MG capsule Take by mouth.     HYDROcodone-acetaminophen (NORCO) 10-325 MG per tablet Take 10-325 tablets by mouth daily.     insulin glargine (LANTUS) 100 UNIT/ML injection Inject 30 Units into the skin daily.     insulin lispro (HUMALOG) 100 UNIT/ML KwikPen INJECT 14 UNITS AT BREAKFAST AND 10 UNITS AT LUNCH AND SUPPER UNDER THE SKIN AS DIRECTED     metFORMIN (GLUCOPHAGE) 1000 MG tablet Take 1,000 mg by mouth daily.     NEXIUM 40 MG capsule Take 40 mg by mouth daily.     NOVOFINE PLUS 32G X 4 MM MISC USE WITH TOUJEO AND HUMALOG PEND FOUR TIMES DAILY     propranolol (INDERAL) 40 MG tablet Take 40 mg by mouth 2 (two) times daily.      Ropinirole HCl 12 MG TB24 TAKE ONE (1) TABLET BY MOUTH EACH DAY 90 tablet 1   telmisartan-hydrochlorothiazide (MICARDIS HCT) 80-25 MG per tablet Take 1 tablet by  mouth daily.      No facility-administered medications prior to visit.    Review of Systems  Review of Systems  Constitutional: Positive for fatigue.  Respiratory: Positive for cough and shortness of breath. Negative for chest tightness and wheezing.   Musculoskeletal: Positive for back pain and neck pain. Negative for joint swelling.    Physical Exam  BP 124/76    Pulse 76    Temp 97.6 F (36.4 C) (Oral)    Ht _0  (1.727 m)    Wt 219 lb 12.8 oz (99.7 kg)    SpO2 90%    BMI 33.42 kg/m  Physical Exam Constitutional:      Appearance: Normal appearance.  HENT:     Head: Normocephalic and atraumatic.  Cardiovascular:     Rate and Rhythm: Normal rate and regular rhythm.  Pulmonary:     Breath sounds: Rales present.     Comments: Rales at bases; O2 5L Musculoskeletal:        General: No swelling or deformity. Normal range of motion.  Skin:    General: Skin is warm and dry.  Neurological:     General: No focal deficit present.     Mental Status: He is alert and oriented to person, place, and time. Mental status is at baseline.  Psychiatric:        Mood and Affect: Mood normal.        Behavior: Behavior normal.        Thought Content: Thought content normal.        Judgment: Judgment normal.      Lab Results:  CBC    Component Value Date/Time   HGB 13.8 08/24/2008 0902    BMET    Component Value Date/Time   NA 137 01/07/2020 0807   K 4.2 01/07/2020 0807   CL 99 01/07/2020 0807   CO2 25 01/07/2020 0807   GLUCOSE 228 (H) 01/07/2020 0807   GLUCOSE 120 (H) 08/19/2008 1530   BUN 19 01/07/2020 0807   CREATININE 1.15 01/07/2020 0807   CALCIUM 9.8 01/07/2020 0807   GFRNONAA 64 01/07/2020 0807   GFRAA 74 01/07/2020 0807  BNP    Component Value Date/Time   BNP 14.4 12/31/2019 0825    ProBNP No results found for: PROBNP  Imaging: No results found.   Assessment & Plan:   ILD (interstitial lung disease) ([place]) - Increased Symptom burden and oxygen  demand over last three months. Patient is attending pulmonary rehab. ILD score is 23 today (11). Serology positive for ANA, sed rate 39. RF negative. Unsure if patient would be candidate for bronchoscopy bx d/t oxygen requirements and lung function. Patient has follow-up next week with Dr. Alveria Apley    Chronic respiratory failure with hypoxia [facility]) - Patient requiring 5L oxygen on exertion to maintain O2 >90%. He is needing 6.5L during pulmonary rehab. Patient re-qualified for oxygen today. Uses [employer]. Needs larger tanks/concentrator to accommodate higher flow.    Martyn Ehrich, NP 05/07/2020

## 2020-05-10 NOTE — Progress Notes (Signed)
ILD + , emphysema +, nodule +, Coronary art calcification +. Will discuss on 05/11/20 vsiit

## 2020-05-11 ENCOUNTER — Ambulatory Visit (INDEPENDENT_AMBULATORY_CARE_PROVIDER_SITE_OTHER): Payer: Medicare PPO | Admitting: Internal Medicine

## 2020-05-11 ENCOUNTER — Encounter: Payer: Self-pay | Admitting: Internal Medicine

## 2020-05-11 ENCOUNTER — Other Ambulatory Visit: Payer: Self-pay

## 2020-05-11 ENCOUNTER — Telehealth: Payer: Self-pay | Admitting: Internal Medicine

## 2020-05-11 ENCOUNTER — Encounter (HOSPITAL_COMMUNITY)
Admission: RE | Admit: 2020-05-11 | Discharge: 2020-05-11 | Disposition: A | Discharge status: Home / Self Care | Payer: Medicare PPO | Source: Ambulatory Visit | Attending: Internal Medicine | Admitting: Internal Medicine

## 2020-05-11 VITALS — BP 130/68 | HR 87 | Ht 68.0 in | Wt 205.8 lb

## 2020-05-11 DIAGNOSIS — R911 Solitary pulmonary nodule: Secondary | ICD-10-CM | POA: Diagnosis not present

## 2020-05-11 DIAGNOSIS — J84112 Idiopathic pulmonary fibrosis: Secondary | ICD-10-CM

## 2020-05-11 DIAGNOSIS — J849 Interstitial pulmonary disease, unspecified: Secondary | ICD-10-CM

## 2020-05-11 DIAGNOSIS — I288 Other diseases of pulmonary vessels: Secondary | ICD-10-CM | POA: Diagnosis not present

## 2020-05-11 DIAGNOSIS — J439 Emphysema, unspecified: Secondary | ICD-10-CM

## 2020-05-11 DIAGNOSIS — J9611 Chronic respiratory failure with hypoxia: Secondary | ICD-10-CM | POA: Diagnosis not present

## 2020-05-11 LAB — HEPATIC FUNCTION PANEL
ALT: 10 U/L (ref 0–53)
AST: 25 U/L (ref 0–37)
Albumin: 3.9 g/dL (ref 3.5–5.2)
Alkaline Phosphatase: 62 U/L (ref 39–117)
Bilirubin, Direct: 0.2 mg/dL (ref 0.0–0.3)
Total Bilirubin: 0.7 mg/dL (ref 0.2–1.2)
Total Protein: 6.8 g/dL (ref 6.0–8.3)

## 2020-05-11 MED ORDER — SPIRIVA RESPIMAT 2.5 MCG/ACT IN AERS: 2.0000 | INHALATION_SPRAY | Freq: Every day | RESPIRATORY_TRACT | 0 refills | Status: DC

## 2020-05-11 MED ORDER — SPIRIVA RESPIMAT 2.5 MCG/ACT IN AERS: 2.0000 | INHALATION_SPRAY | Freq: Every day | RESPIRATORY_TRACT | 5 refills | Status: AC

## 2020-05-11 NOTE — Telephone Encounter (Signed)
I will do that. Thanks

## 2020-05-11 NOTE — Progress Notes (Signed)
OV 02/19/2020  Subjective:  Patient ID: Steven Pitts, male , DOB: May 07, 1946 , age 74 y.o. , MRN: [id number] , ADDRESS: [address] [place] [address]   02/19/2020 -   Chief Complaint  Patient presents with  . Consult    Referred by Dr. Einar Gip for SOB for the past 3 months. Notices the SOB with exertion. Currently not on any oxygen therapy.      Steven Pitts 74 y.o. -has been referred by Dr. Einar Gip cardiologist for evaluation of dyspnea and concern for interstitial lung disease.  According to the patient he has had insidious onset of dyspnea and cough for the last 3 months.  It is progressive.  In fact today when he was roomed in his pulse ox was found to be 84% when he walked in and sat down.  This is a new onset dyspnea.  He has never had this problem before.  There is no wheezing there is no orthopnea there is no proximal nocturnal dyspnea.  He says his cardiac issues have been cleared by Dr. Einar Gip.  Review of the records indicate that patient has hypertension, diabetes, hyperlipidemia obstructive sleep apnea on CPAP and restless leg syndrome.  He did have a chest x-ray March  2021 which I visualized that was concerning for pneumonia according to the notes but to me looks like interstitial lung disease.  It appears he was initially started on Lasix by the primary care team and this helped an improvement but not resolution.  He had echo end of March 2021 that shows grade 1 diastolic dysfunction with ejection fraction 55-60%.  Dr. Einar Gip felt patient was euvolemic after diuresis but still has residual basal crackles [I also auscultate this] and therefore has been referred here to the ILD center.   [facility] Comprehensive ILD Questionnaire  Symptoms:    SYMPTOM SCALE - ILD 02/19/2020   O2 use ra  Shortness of Breath 0 -> 5 scale with 5 being worst (score 6 If unable to do)  At rest 0  Simple tasks - showers, clothes change, eating, shaving 0  Household (dishes,  doing bed, laundry) 3  Shopping 4  Walking level at own pace 2  Walking up Stairs 5  Total (30-36) Dyspnea Score 11  How bad is your cough? 2  How bad is your fatigue yes  How bad is nausea no  How bad is vomiting?  no  How bad is diarrhea? no  How bad is anxiety? x  How bad is depression x       Past Medical History : Positive for diastolic heart failure .  Positive for sleep apnea for the last several years.  Positive for hiatal hernia/acid reflux for the last few decades.  Positive for diabetes for the last several years..  Negative for asthma or COPD.  Negative for collagen vascular disease or vasculitis.  Negative for HIV.  Negative for thyroid disease.  Negative for stroke or seizures.  Negative for mononucleosis.  Negative for blood clots pleurisy.   ROS: Positive for fatigue for the last 3 months.  Positive for joint stiffness and swelling for the last several years.  Has dry eyes and mouth for the last several years.  Has noticed color change in his fingers this past winter.  Otherwise no weight loss or nausea.  He does snore and is being treated for sleep apnea.   FAMILY HISTORY of LUNG DISEASE: Negative for pulmonary fibrosis or COPD or asthma  sarcoid or cystic fibrosis.   EXPOSURE HISTORY: Smokes cigarettes between [place].  35 cigarettes/day.  No passive smoking.  He smoked pipes in the past.  No marijuana use currently but he did smoke quit in 1971 in 1980 very little.  No vaping or electronic cigarettes.  Did not use cocaine at any time.  Do not use IV drug use.   HOME and HOBBY DETAILS : [place].  He was in a damp living environment in [place].  He was exposed to agent orange.  Current home does not have a humidifier.  No mold or mildew no nebulizer machine.  No steam iron use.  No misting [place] inside the house no pet birds or parakeets.  No pet gerbils or hamsters or rabbits.  There is no mold in the [facility]  duct.  Does not do any gardening.  No birds in the house no flood of water damage.  No strong mats.  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  Did do pest control work and Psychologist, counselling in the past.  Inorganic antigen: Positive exposure to [employer] and woodworking Financial trader.  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  However chest x-ray done in 2007 looks clear to me but March 2021 chest x-ray that I personally visualized definitely shows ILD.   Simple office walk 185 feet x  3 laps goal with forehead probe 02/19/2020   O2 used ra  Number laps completed 1/4 of 1 laps  Comments about pace Slow pace with cane  Resting Pulse Ox/HR 88% and 63/min  Final Pulse Ox/HR 83% and 77/min  Desaturated </= 88% Even at rest 88%  Desaturated <= 3% points yes  Got Tachycardic >/= 90/min no  Symptoms at end of test dyspneic  Miscellaneous comments Corrected wuthg 3 L to complete 1 lap with cane     03/19/20- [name], NP 74 year old male former smoker (quit 1990) seen for pulmonary consult Feb 19, 2020 for progressive dyspnea since March 2021 and possible ILD  Patient returns for a 1 month follow-up.  Patient was seen last visit for pulmonary consult for shortness of breath since March 2021.  Patient complains over the last 3 months he has been getting progressively worse with shortness of breath with minimum activity, decreased activity tolerance.  Prior to this past year he was able to mow his grass he says he is not able to do that.  He is able to stand still without any significant shortness of breath.  He does do woodworking and feels that he is okay to do this.  He has  now been started on oxygen due to desaturations with activities.  He is currently on oxygen 2 L at rest and 3 L with activity.  Patient says he does have a dry cough.  He denies any hemoptysis chest pain orthopnea or PND.  Patient was set up for pulmonary function testing that was done  on February 24, 2020 showed moderate restriction with an FEV1 at 77%, ratio 78, FVC 72%, no significant bronchodilator response, severe diffusing defect with a DLCO at  37%. We discussed increasing his physical reconditioning with pulmonary rehab which he is in agreement.    05/07/2020-Interim history Patient presents today to re-qualify for oxygen. During last visit he was referred to pulmonary rehab, ordered for serology labs and ONO. Autoimmune labs positive for ANA, sed rate 39. Rheumatoid factor negative. Pulmonary function testing on 02/24/20 showed moderate restriction with no BD response and severe diffusion defect at 37%. He reports moderate-severe dyspnea, fatigue and occasional cough. He has had neck, lower back and should pain for 10+ years. He has started pulmonary rehab, he is attending twice a week. He normally wears 3L oxygen at rest, 5L on exertion during the day and wears 3L at night. He has been needing to use 6-6.5L during pulmonary rehab. Today on walk he needed 5L to maintain O2 >90%. He is scheduled for follow-up with Dr. Chase Caller next week.    OV 05/11/2020  Subjective:  Patient ID: Steven Pitts, male , DOB: March 26, 1946 , age 15 y.o. , MRN: [id number] , ADDRESS: [address] [place] [facility] [address]   05/11/2020 -   Chief Complaint  Patient presents with  . Follow-up    Pt states he has been okay since last visit and states he feels like his breathing has gotten worse. Pt is currently in pulmonary rehab which he states he feels is helping.     HPI KEVIN SPACE 74 y.o. -saw him around Memorial Day 2021.  Since then he is seen the nurse practitioner.  We initiated suspicion of interstitial lung  disease work-up.  His results show that he has probable UIP on the CT chest.  His autoimmune panel is negative.  There is no air-trapping.  Based on this I will conclude that he has idiopathic pulmonary fibrosis.  Nurse practitioners of seen him have commenced pulmonary rehabilitation which she states is helping him.  However he says that he is significantly progressed.  In fact documentation shows that he is now requiring 6 L of nasal cannula at rehabilitation.  This is a significant progression.  His symptom scores as documented by nurse practitioner in the last couple of months are also demonstrating symptom worsening.  He has indicated his goal is to fight this to the extent possible.  He is interested in all therapeutic options that are on the table.  His BMI is 31.  He uses a cane because of Parkinson's.  There are no other new issues  Incidental findings include associated emphysema and 7 mm right middle lobe nodule.  He has had cardiac stress test that is negative for ischemia.  His cardiologist is Dr. Einar Gip.    SYMPTOM SCALE - ILD 02/19/2020  05/07/2020   O2 use ra 3-5L; 6.5L with pulmonary rehab  Shortness of Breath 0 -> 5 scale with 5 being worst (score 6 If unable to do) 0 -> 5 scale with 5 being worst (score 6 If unable to do)  At rest 0 2  Simple tasks - showers, clothes change, eating, shaving 0 3  Household (dishes, doing bed, laundry) 3 4  Shopping 4 5  Walking level at own pace 2 4  Walking up Stairs 5 5  Total (30-36) Dyspnea Score 11 23  How bad is your cough? 2 1  How bad is your fatigue yes 5  How bad is nausea no 0  How bad is vomiting?  no 0  How bad is diarrhea? no 0  How bad is anxiety? x 3  How bad is  depression x 4    Simple office walk 185 feet x  3 laps goal with forehead probe 02/19/2020   O2 used ra  Number laps completed 1/4 of 1 laps  Comments about pace Slow pace with cane  Resting Pulse Ox/HR 88% and 63/min  Final Pulse Ox/HR 83% and 77/min    Desaturated </= 88% Even at rest 88%  Desaturated <= 3% points yes  Got Tachycardic >/= 90/min no  Symptoms at end of test dyspneic  Miscellaneous comments Corrected wuthg 3 L to complete 1 lap with cane    TEST/EVENTS : June 2021 IMPRESSION: 1. Spectrum of findings compatible with basilar predominant fibrotic interstitial lung disease without frank honeycombing. Findings are categorized as probable UIP per consensus guidelines: Diagnosis of Idiopathic Pulmonary Fibrosis: An Official ATS/ERS/JRS/ALAT Clinical Practice Guideline. [place], Iss 5, [phone], May 26 2017. 2. Dilated main pulmonary artery, suggesting pulmonary arterial hypertension. 3. Right middle lobe 7 mm solid pulmonary nodule. Non-contrast chest CT at 6-12 months is recommended. If the nodule is stable at time of repeat CT, then future CT at 18-24 months (from today's scan) is considered optional for low-risk patients, but is recommended for high-risk patients. This recommendation follows the consensus statement: Guidelines for Management of Incidental Pulmonary Nodules Detected on CT Images: From the Fleischner Society 2017; Radiology 2017; 284:228-243. 4. Nonspecific mild to moderate mediastinal and mild bilateral hilar lymphadenopathy, potentially reactive. Recommend attention on follow-up chest CT with IV contrast in 3-6 months. 5. Moderate emphysema with diffuse bronchial wall thickening, suggesting COPD. 6. Three-vessel coronary atherosclerosis. 7. Aortic Atherosclerosis (ICD10-I70.0) and Emphysema (ICD10-J43.9).   Electronically Signed   By: Ilona Sorrel M.D.   On: 03/06/2020 14:45      PFT Results Latest Ref Rng & Units 02/24/2020  FVC-Pre L 3.03  FVC-Predicted Pre % 74  FVC-Post L 2.94  FVC-Predicted Post % 72  Pre FEV1/FVC % % 76  Post FEV1/FCV % % 78  FEV1-Pre L 2.29  FEV1-Predicted Pre % 77  FEV1-Post L 2.30  DLCO uncorrected ml/min/mmHg 9.03  DLCO UNC% % 37   DLVA Predicted % 53  TLC L 4.82  TLC % Predicted % 72  RV % Predicted % 76    ROS - per HPI  Results for ENDER, RORKE (MRN [id number]) as of 05/11/2020 11:37  Ref. Range 03/19/2020 12:04  Anti Nuclear Antibody (ANA) Latest Ref Range: NEGATIVE  POSITIVE (A)  ANA Pattern 1 Unknown Nuclear, Speckled (A)  ANA Titer 1 Latest Units: titer 1:40 (H)  Angiotensin-Converting Enzyme Latest Ref Range: 9 - 67 U/L 26  Cyclic Citrullin Peptide Ab Latest Units: UNITS <16  ds DNA Ab Latest Units: IU/mL 1  Myeloperoxidase Abs Latest Units: AI <1.0  Serine Protease 3 Latest Units: AI <1.0  RA Latex Turbid. Latest Ref Range: <14 IU/mL <14  SSA (Ro) (ENA) Antibody, IgG Latest Ref Range: <1.0 NEG AI <1.0 NEG  SSB (La) (ENA) Antibody, IgG Latest Ref Range: <1.0 NEG AI <1.0 NEG  Scleroderma (Scl-70) (ENA) Antibody, IgG Latest Ref Range: <1.0 NEG AI <1.0 NEG     has a past medical history of Abnormality of gait, Anxiety, Diabetes ([place]), Essential and other specified forms of tremor, High blood pressure, Other speech disturbance(784.59), and Parkinson's disease ([place]).   reports that he quit smoking about 31 years ago. His smoking use included cigarettes. He has never used smokeless tobacco.  Past Surgical History:  Procedure Laterality Date  . cataract  surgery Right   . HERNIA REPAIR    . NECK SURGERY     x4  . ROTATOR CUFF REPAIR Left     Allergies  Allergen Reactions  . Gadolinium Derivatives Other (See Comments)    Patient sneezed immediately after administering gadolinium/ no treatment/ pt had no other reaction//jv    Immunization History  Administered Date(s) Administered  . PFIZER SARS-COV-2 Vaccination 11/03/2019, 11/26/2019    Family History  Problem Relation Age of Onset  . Aneurysm Mother   . Alcohol abuse Father   . Parkinson's disease Brother      Current Outpatient Medications:  .  ADVAIR DISKUS 250-50 MCG/DOSE AEPB, Inhale 1 puff into the lungs 2 (two) times daily.,  Disp: , Rfl:  .  albuterol (VENTOLIN HFA) 108 (90 Base) MCG/ACT inhaler, INHALE 2 PUFFS EVERY 4-6 HOURS AS NEEDED FOR COUGH/SHORTNESS OF BREATH, Disp: , Rfl:  .  Armodafinil 150 MG tablet, Take 150 mg by mouth every morning., Disp: , Rfl:  .  aspirin EC 81 MG tablet, Take 1 tablet (81 mg total) by mouth daily., Disp: 90 tablet, Rfl: 3 .  atorvastatin (LIPITOR) 20 MG tablet, Take 20 mg by mouth daily., Disp: , Rfl:  .  carbidopa-levodopa (SINEMET IR) 25-100 MG tablet, TAKE 1 TABLET BY MOUTH 5 TIMES PER DAY, Disp: 150 tablet, Rfl: 5 .  DULoxetine (CYMBALTA) 60 MG capsule, Take 60 mg by mouth daily., Disp: , Rfl:  .  empagliflozin (JARDIANCE) 25 MG TABS tablet, Take 25 mg by mouth daily., Disp: , Rfl:  .  furosemide (LASIX) 20 MG tablet, TAKE 1 TABLET BY MOUTH DAILY, Disp: 90 tablet, Rfl: 1 .  gabapentin (NEURONTIN) 300 MG capsule, Take by mouth., Disp: , Rfl:  .  HYDROcodone-acetaminophen (NORCO) 10-325 MG per tablet, Take 10-325 tablets by mouth daily., Disp: , Rfl:  .  insulin glargine (LANTUS) 100 UNIT/ML injection, Inject 30 Units into the skin daily., Disp: , Rfl:  .  insulin lispro (HUMALOG) 100 UNIT/ML KwikPen, INJECT 14 UNITS AT BREAKFAST AND 10 UNITS AT LUNCH AND SUPPER UNDER THE SKIN AS DIRECTED, Disp: , Rfl:  .  metFORMIN (GLUCOPHAGE) 1000 MG tablet, Take 1,000 mg by mouth daily., Disp: , Rfl:  .  NEXIUM 40 MG capsule, Take 40 mg by mouth daily., Disp: , Rfl:  .  NOVOFINE PLUS 32G X 4 MM MISC, USE WITH TOUJEO AND HUMALOG PEND FOUR TIMES DAILY, Disp: , Rfl:  .  propranolol (INDERAL) 40 MG tablet, Take 40 mg by mouth 2 (two) times daily. , Disp: , Rfl:  .  Ropinirole HCl 12 MG TB24, TAKE ONE (1) TABLET BY MOUTH EACH DAY, Disp: 90 tablet, Rfl: 1 .  telmisartan-hydrochlorothiazide (MICARDIS HCT) 80-25 MG per tablet, Take 1 tablet by mouth daily. , Disp: , Rfl:  .  Tiotropium Bromide Monohydrate (SPIRIVA RESPIMAT) 2.5 MCG/ACT AERS, Inhale 2 puffs into the lungs daily., Disp: 4 g, Rfl: 5       Objective:   Vitals:   05/11/20 1138  BP: 130/68  Pulse: 87  SpO2: (!) 89%  Weight: 205 lb 12.8 oz (93.4 kg)  Height: _0  (1.727 m)    Estimated body mass index is 31.29 kg/m as calculated from the following:   Height as of this encounter: _1  (1.727 m).   Weight as of this encounter: 205 lb 12.8 oz (93.4 kg).  _2 @  Filed Weights   05/11/20 1138  Weight: 205 lb 12.8 oz (93.4 kg)  Physical Exam Bearded male.  Pleasant.  Has crackles.  Has a cane.  Has visceral obesity no elevated JVP.  No neck nodes.  No sinus no clubbing no edema.  Alert and oriented x3.     Assessment:       ICD-10-CM   1. Chronic respiratory failure with hypoxia (HCC)  J96.11 Hepatic function panel    Ambulatory referral to Pulmonology  2. IPF (idiopathic pulmonary fibrosis) ([place])  J84.112 Hepatic function panel    Ambulatory referral to Pulmonology  3. Pulmonary emphysema, unspecified emphysema type ([place])  J43.9   4. Enlarged pulmonary artery (HCC)  I28.8   5. Nodule of middle lobe of right lung  R91.1   6. Interstitial pulmonary disease (HCC)  J84.9 CT Chest High Resolution   Interstitial lung disease: I think clinically this is IPF with a very very high pretest probably.  Is based on age greater than 48, Caucasian ethnicity, veteran, previous smoker, negative serology and no air-trapping although he has some exposures hypersensitive pneumonitis. He is having progressive decline.  And this could be because of rapid progressing phenotype and IPF.  At this point in time his therapeutic options are closing in on him.  We discussed antifibrotic's as preventative measure.  We discussed the 2 antifibrotic's.  He prefers pirfenidone because he does not want diarrhea as a side effect.  He prefers to have nausea and weight loss and low appetite.  He understands the need to apply sunscreen and regular liver function test monitoring.  We will refer him to pharmacy for that.  He is interested in  the possibility of lung transplant.  Explained to him obesity, financial issues, Parkinson and mobility issues could keep him away from transplant.  Nevertheless he is interested in getting a transplant referral done.  He has associated emphysema: For this we will start Spiriva  Associated enlarged pulmonary arteries: This was seen on the CT chest.  I have sent a message to Dr. Einar Gip to consider him for right heart catheterization  Chronic hypoxemic respiratory failure due to all of the above: Use oxygen 6 L with exertion  Has 7 mm lung nodule: We will repeat a CT chest in 6 months.    Plan:     Patient Instructions     ICD-10-CM   1. Chronic respiratory failure with hypoxia (HCC)  J96.11   2. IPF (idiopathic pulmonary fibrosis) ([place])  J84.112   3. Pulmonary emphysema, unspecified emphysema type ([place])  J43.9   4. Enlarged pulmonary artery (HCC)  I28.8   5. Nodule of middle lobe of right lung  R91.1    Chronic respiratory failure with hypoxia (HCC) IPF (idiopathic pulmonary fibrosis) (HCC)  - the diagnosis is IPF  - it is a progressive disease - in your case you are rapidly proggressing  Plan  - start esbriet per protocol  - refer Amber Yopp for post initiation followup - approx 2-4 weeks from now - check LFT blood work 05/11/2020 - refer [facility] transplant [lung]  -TheY might disqualify you on basis of weight and Parkinson and mobility but is worth finding out -Continue oxygen at rest and 6 L with exertion -Get rid of feather pillow    Pulmonary emphysema, unspecified emphysema type ([place])  -You have associated emphysema with prior smoking  Plan - Please start spiriva RESPIMAT 2 puff daily - take sample, script and show technique   Enlarged pulmonary artery ([place]) - SEEN ON ct   -If you have pulmonary hypertension this is  contributing to excess oxygen need  Plan -I wil send a message to Dr. Einar Gip and see if he should do a right heart catheterization on you  Nodule of  middle lobe of right lung -7 mm June 2021 -low risk for lung cancer  Plan -Repeat high-resolution CT chest December 2021  Follow-up -Return to see me in October 2021 ON A 30-minute slot on any day -for face-to-face visit     ( Level 05 visit: Estb 40-54 min   in  visit type: on-site physical face to visit  in total care time and counseling or/and coordination of care by this undersigned MD - Dr Brand Males. This includes one or more of the following on this same day 05/11/2020: pre-charting, chart review, note writing, documentation discussion of test results, diagnostic or treatment recommendations, prognosis, risks and benefits of management options, instructions, education, compliance or risk-factor reduction. It excludes time spent by the [facility] or office staff in the care of the patient. Actual time 60 min)    SIGNATURE    Dr. Brand Males, M.D., F.C.C.P,  Pulmonary and Critical Care Medicine Staff Physician, [place] Director - Interstitial Lung Disease  Program  Pulmonary [facility] [place], [place], [address]  Pager: [phone], If no answer or between  15:00h - 7:00h: call [phone] Telephone: [phone]  12:18 PM 05/11/2020

## 2020-05-11 NOTE — Telephone Encounter (Signed)
Thanks

## 2020-05-11 NOTE — Telephone Encounter (Signed)
Steven Pitts has IPF. THanks for the referral. HE has progressed much faster than anticipated just in a matter of few months.  He is requiring 6 L nasal cannula with oxygen.  He has some amount of concomitant emphysema.  I wonder if he has pulmonary hypertension because his CT chest suggests enlarged pulmonary arteries.  Please note that I am sending him to [facility] for lung transplant evaluation but they could disqualify him based on weight, mobility and Parkinson although he is doing pulmonary rehabilitation  Plan -Please consider him for echo or if you feel suitable right heart catheterization with consideration for inhaled treprostinil

## 2020-05-11 NOTE — Patient Instructions (Addendum)
ICD-10-CM   1. Chronic respiratory failure with hypoxia (HCC)  J96.11   2. IPF (idiopathic pulmonary fibrosis) ([place])  J84.112   3. Pulmonary emphysema, unspecified emphysema type ([place])  J43.9   4. Enlarged pulmonary artery (HCC)  I28.8   5. Nodule of middle lobe of right lung  R91.1    Chronic respiratory failure with hypoxia (HCC) IPF (idiopathic pulmonary fibrosis) (HCC)  - the diagnosis is IPF  - it is a progressive disease - in your case you are rapidly proggressing  Plan  - start esbriet per protocol  - refer Amber Yopp for post initiation followup - approx 2-4 weeks from now - check LFT blood work 05/11/2020 - refer [facility] transplant [lung]  -TheY might disqualify you on basis of weight and Parkinson and mobility but is worth finding out -Continue oxygen at rest and 6 L with exertion -Get rid of feather pillow    Pulmonary emphysema, unspecified emphysema type ([place])  -You have associated emphysema with prior smoking  Plan - Please start spiriva RESPIMAT 2 puff daily - take sample, script and show technique   Enlarged pulmonary artery ([place]) - SEEN ON ct   -If you have pulmonary hypertension this is contributing to excess oxygen need  Plan -I wil send a message to Dr. Einar Gip and see if he should do a right heart catheterization on you  Nodule of middle lobe of right lung -7 mm June 2021 -low risk for lung cancer  Plan -Repeat high-resolution CT chest December 2021  Follow-up -Return to see me in October 2021 ON A 30-minute slot on any day -for face-to-face visit

## 2020-05-11 NOTE — Addendum Note (Signed)
Addended by: Suzzanne Cloud E on: 05/11/2020 12:25 PM   Modules accepted: Orders

## 2020-05-11 NOTE — Progress Notes (Signed)
Lft normal

## 2020-05-11 NOTE — Progress Notes (Signed)
Daily Session Note  Patient Details  Name: CARLOUS OLIVARES MRN: [id number] Date of Birth: [birth year] Referring Provider:     Pulmonary Rehab Walk Test from 04/14/2020 in [place]  Referring Provider Brand Males      Encounter Date: 05/11/2020  Check In:  Session Check In - 05/11/20 1026      Check-In   Supervising physician immediately available to respond to emergencies [facility] immediately available    Physician(s) Dr. Claybon Jabs    Location [facility]    Staff Present Maurice Small, RN, Bjorn Loser, MS, CEP, Exercise Physiologist;Lisa Jani Gravel, MS, ACSM-CEP, Exercise Physiologist    Virtual Visit No    Medication changes reported     No    Fall or balance concerns reported    No    Tobacco Cessation No Change    Warm-up and Cool-down Performed on first and last piece of equipment    Resistance Training Performed Yes    VAD Patient? No    PAD/SET Patient? No      Pain Assessment   Currently in Pain? No/denies    Multiple Pain Sites No           Capillary Blood Glucose: No results found for this or any previous visit (from the past 24 hour(s)).    Social History   Tobacco Use  Smoking Status Former Smoker  . Types: Cigarettes  . Quit date: 12/17/1988  . Years since quitting: 31.4  Smokeless Tobacco Never Used  Tobacco Comment   quit 1980    Goals Met:  Proper associated with RPD/PD & O2 Sat Exercise tolerated well No report of cardiac concerns or symptoms Strength training completed today  Goals Unmet:  Not Applicable  Comments: Service time is from 1000 to 1100    Dr. Fransico Him is Medical Director for Cardiac Rehab at [facility].

## 2020-05-12 ENCOUNTER — Telehealth: Payer: Self-pay | Admitting: Pharmacist

## 2020-05-12 NOTE — Progress Notes (Deleted)
HPI  Patient presents today to [facility] for Initial appt with pharmacy team for Esbriet medication management. Pertinent past medical history includes IPF, OSA, HTN, diabetes, and parkinson's disease. He is naive to anti-fibrotic therapy.  OBJECTIVE Allergies  Allergen Reactions  . Gadolinium Derivatives Other (See Comments)    Patient sneezed immediately after administering gadolinium/ no treatment/ pt had no other reaction//jv    Outpatient Encounter Medications as of 05/13/2020  Medication Sig  . ADVAIR DISKUS 250-50 MCG/DOSE AEPB Inhale 1 puff into the lungs 2 (two) times daily.  Marland Kitchen albuterol (VENTOLIN HFA) 108 (90 Base) MCG/ACT inhaler INHALE 2 PUFFS EVERY 4-6 HOURS AS NEEDED FOR COUGH/SHORTNESS OF BREATH  . Armodafinil 150 MG tablet Take 150 mg by mouth every morning.  Marland Kitchen aspirin EC 81 MG tablet Take 1 tablet (81 mg total) by mouth daily.  Marland Kitchen atorvastatin (LIPITOR) 20 MG tablet Take 20 mg by mouth daily.  . carbidopa-levodopa (SINEMET IR) 25-100 MG tablet TAKE 1 TABLET BY MOUTH 5 TIMES PER DAY  . DULoxetine (CYMBALTA) 60 MG capsule Take 60 mg by mouth daily.  . empagliflozin (JARDIANCE) 25 MG TABS tablet Take 25 mg by mouth daily.  . furosemide (LASIX) 20 MG tablet TAKE 1 TABLET BY MOUTH DAILY  . gabapentin (NEURONTIN) 300 MG capsule Take by mouth.  Marland Kitchen HYDROcodone-acetaminophen (NORCO) 10-325 MG per tablet Take 10-325 tablets by mouth daily.  . insulin glargine (LANTUS) 100 UNIT/ML injection Inject 30 Units into the skin daily.  . insulin lispro (HUMALOG) 100 UNIT/ML KwikPen INJECT 14 UNITS AT BREAKFAST AND 10 UNITS AT LUNCH AND SUPPER UNDER THE SKIN AS DIRECTED  . metFORMIN (GLUCOPHAGE) 1000 MG tablet Take 1,000 mg by mouth daily.  Marland Kitchen NEXIUM 40 MG capsule Take 40 mg by mouth daily.  Marland Kitchen NOVOFINE PLUS 32G X 4 MM MISC USE WITH TOUJEO AND HUMALOG PEND FOUR TIMES DAILY  . propranolol (INDERAL) 40 MG tablet Take 40 mg by mouth 2 (two) times daily.   . Ropinirole HCl 12 MG TB24  TAKE ONE (1) TABLET BY MOUTH EACH DAY  . telmisartan-hydrochlorothiazide (MICARDIS HCT) 80-25 MG per tablet Take 1 tablet by mouth daily.   . Tiotropium Bromide Monohydrate (SPIRIVA RESPIMAT) 2.5 MCG/ACT AERS Inhale 2 puffs into the lungs daily.   No facility-administered encounter medications on file as of 05/13/2020.     Immunization History  Administered Date(s) Administered  . PFIZER SARS-COV-2 Vaccination 11/03/2019, 11/26/2019     PFT's TLC  Date Value Ref Range Status  02/24/2020 4.82 L Preliminary     CMP     Component Value Date/Time   NA 137 01/07/2020 0807   K 4.2 01/07/2020 0807   CL 99 01/07/2020 0807   CO2 25 01/07/2020 0807   GLUCOSE 228 (H) 01/07/2020 0807   GLUCOSE 120 (H) 08/19/2008 1530   BUN 19 01/07/2020 0807   CREATININE 1.15 01/07/2020 0807   CALCIUM 9.8 01/07/2020 0807   PROT 6.8 05/11/2020 1225   ALBUMIN 3.9 05/11/2020 1225   AST 25 05/11/2020 1225   ALT 10 05/11/2020 1225   ALKPHOS 62 05/11/2020 1225   BILITOT 0.7 05/11/2020 1225   GFRNONAA 64 01/07/2020 0807   GFRAA 74 01/07/2020 0807     CBC    Component Value Date/Time   HGB 13.8 08/24/2008 0902     LFT's Hepatic Function Latest Ref Rng & Units 05/11/2020  Total Protein 6.0 - 8.3 g/dL 6.8  Albumin 3.5 - 5.2 g/dL 3.9  AST 0 - 37  U/L 25  ALT 0 - 53 U/L 10  Alk Phosphatase 39 - 117 U/L 62  Total Bilirubin 0.2 - 1.2 mg/dL 0.7  Bilirubin, Direct 0.0 - 0.3 mg/dL 0.2      ASSESSMENT  1. Esbriet Medication Management  Patient counseled on purpose, proper use, and potential adverse effects including nausea, vomiting, abdominal pain, GERD, weight loss, arthralgia, dizziness, and suns sensitivity/rash.  Stressed the importance of routine lab monitoring. Will monitor LFT's every month for the first 6 months of treatment then every 3 months. Will monitor CBC every 3 months.  Starting dose will be Esbriet 267 mg 1 tablet three times daily for 7 days, then 2 tablets three times daily for 7  days, then 3 tablets three times daily.  Maintenance dose will be 801 mg 1 tablet three times daily if tolerated.  Stressed the importance of taking with meals to minimize stomach upset.    Esbriet was approved through insurance ([employer] secondary) and he has a zero Systems analyst.  Prescription sent to [facility].  2. Medication Reconciliation  A drug regimen assessment was performed, including review of allergies, interactions, disease-state management, dosing and immunization history. Medications were reviewed with the patient, including name, instructions, indication, goals of therapy, potential side effects, importance of adherence, and safe use.  Drug interaction(s):   3. Immunizations  Patient is indicated for the influenzae, pneumonia, and shingles vaccinations.  PLAN ***  All questions encouraged and answered.  Instructed patient to call with any further questions or concerns.  Thank you for allowing pharmacy to participate in this patient's care.  This appointment required  {CHL ONC TIME VISIT - OFPUL:[id number]} of patient care (this includes precharting, chart review, review of results, face-to-face care, etc.).

## 2020-05-12 NOTE — Telephone Encounter (Signed)
Please start benefits investigation for Esbriet for the treatment of IPF.  Patient has pharmacy appointment tomorrow and will have fill out patient assistance if needed.   Mariella Saa, PharmD, [place], CPP Clinical Specialty Pharmacist (Rheumatology and Pulmonology)  05/12/2020 3:43 PM

## 2020-05-12 NOTE — Progress Notes (Signed)
Called and left detailed message on voicemail per [facility] about LFT result per Dr Chase Caller. Advised patient to please feel free to call us back if he had any questions. Nothing further needed at this time.

## 2020-05-12 NOTE — Telephone Encounter (Signed)
Received notification from [facility] regarding a prior authorization for ESBRIET. Authorization has been APPROVED from 09/26/19 to 09/24/20.   Authorization # [id number]  Ran test claim, patient has [employer] as secondary insurance and his copay for 1 month supply is zero.  4:18 PM Beatriz Chancellor, CPhT

## 2020-05-13 ENCOUNTER — Other Ambulatory Visit: Payer: Medicare PPO

## 2020-05-13 ENCOUNTER — Other Ambulatory Visit: Payer: Self-pay

## 2020-05-13 ENCOUNTER — Encounter (HOSPITAL_COMMUNITY)
Admission: RE | Admit: 2020-05-13 | Discharge: 2020-05-13 | Disposition: A | Discharge status: Home / Self Care | Payer: Medicare PPO | Source: Ambulatory Visit | Attending: Internal Medicine | Admitting: Internal Medicine

## 2020-05-13 VITALS — Wt 206.1 lb

## 2020-05-13 DIAGNOSIS — J849 Interstitial pulmonary disease, unspecified: Secondary | ICD-10-CM | POA: Diagnosis not present

## 2020-05-13 DIAGNOSIS — W19XXXA Unspecified fall, initial encounter: Secondary | ICD-10-CM | POA: Diagnosis not present

## 2020-05-13 DIAGNOSIS — I959 Hypotension, unspecified: Secondary | ICD-10-CM | POA: Diagnosis not present

## 2020-05-13 DIAGNOSIS — J8410 Pulmonary fibrosis, unspecified: Secondary | ICD-10-CM | POA: Diagnosis not present

## 2020-05-13 DIAGNOSIS — J9811 Atelectasis: Secondary | ICD-10-CM | POA: Diagnosis not present

## 2020-05-13 DIAGNOSIS — R9431 Abnormal electrocardiogram [ECG] [EKG]: Secondary | ICD-10-CM | POA: Diagnosis not present

## 2020-05-13 DIAGNOSIS — R531 Weakness: Secondary | ICD-10-CM | POA: Diagnosis not present

## 2020-05-13 DIAGNOSIS — J189 Pneumonia, unspecified organism: Secondary | ICD-10-CM | POA: Diagnosis not present

## 2020-05-13 DIAGNOSIS — R918 Other nonspecific abnormal finding of lung field: Secondary | ICD-10-CM | POA: Diagnosis not present

## 2020-05-13 DIAGNOSIS — R0902 Hypoxemia: Secondary | ICD-10-CM | POA: Diagnosis not present

## 2020-05-13 DIAGNOSIS — R0602 Shortness of breath: Secondary | ICD-10-CM | POA: Diagnosis not present

## 2020-05-13 DIAGNOSIS — Z87891 Personal history of nicotine dependence: Secondary | ICD-10-CM | POA: Diagnosis not present

## 2020-05-13 DIAGNOSIS — J80 Acute respiratory distress syndrome: Secondary | ICD-10-CM | POA: Diagnosis not present

## 2020-05-13 NOTE — Progress Notes (Signed)
Daily Session Note  Patient Details  Name: Steven Pitts MRN: [id number] Date of Birth: 04-May-1946 Referring Provider:     Pulmonary Rehab Walk Test from 04/14/2020 in [place]  Referring Provider Brand Males      Encounter Date: 05/13/2020  Check In:  Session Check In - 05/13/20 1029      Check-In   Supervising physician immediately available to respond to emergencies [facility] immediately available    Physician(s) Dr. Charlett Blake    [facility]    Staff Present Hoy Register, MS, CEP, Exercise Physiologist;Lisa Ysidro Evert, RN;David Ginger Blue, MS, EP-C, CCRP;Jessica Hassell Done, MS, ACSM-CEP, Exercise Physiologist    Virtual Visit No    Medication changes reported     No    Fall or balance concerns reported    No    Tobacco Cessation No Change    Warm-up and Cool-down Performed on first and last piece of equipment    Resistance Training Performed Yes    VAD Patient? No    PAD/SET Patient? No      Pain Assessment   Currently in Pain? No/denies    Multiple Pain Sites No           Capillary Blood Glucose: No results found for this or any previous visit (from the past 24 hour(s)).    Social History   Tobacco Use  Smoking Status Former Smoker  . Types: Cigarettes  . Quit date: 12/17/1988  . Years since quitting: 31.4  Smokeless Tobacco Never Used  Tobacco Comment   quit 1980    Goals Met:  Independence with exercise equipment Exercise tolerated well No report of cardiac concerns or symptoms Strength training completed today  Goals Unmet:  Not Applicable  Comments: Service time is from 1010 to 1121    Dr. Fransico Him is Medical Director for Cardiac Rehab at [facility].

## 2020-05-17 ENCOUNTER — Telehealth (HOSPITAL_COMMUNITY): Payer: Self-pay | Admitting: *Deleted

## 2020-05-17 NOTE — Telephone Encounter (Signed)
Patient left message on voicemail that he will be unable to attend pulmonary rehabilitation session tomorrow 05/18/20.  Sol Passer, MS, ACSM CEP 05/17/20 1359

## 2020-05-18 ENCOUNTER — Encounter (HOSPITAL_COMMUNITY): Payer: Medicare PPO

## 2020-05-18 DIAGNOSIS — Z125 Encounter for screening for malignant neoplasm of prostate: Secondary | ICD-10-CM | POA: Diagnosis not present

## 2020-05-18 DIAGNOSIS — E1149 Type 2 diabetes mellitus with other diabetic neurological complication: Secondary | ICD-10-CM | POA: Diagnosis not present

## 2020-05-18 DIAGNOSIS — E785 Hyperlipidemia, unspecified: Secondary | ICD-10-CM | POA: Diagnosis not present

## 2020-05-20 ENCOUNTER — Encounter (HOSPITAL_COMMUNITY): Payer: Self-pay | Admitting: *Deleted

## 2020-05-20 ENCOUNTER — Other Ambulatory Visit: Payer: Medicare PPO

## 2020-05-20 ENCOUNTER — Encounter (HOSPITAL_COMMUNITY)
Admission: RE | Admit: 2020-05-20 | Discharge: 2020-05-20 | Disposition: A | Discharge status: Home / Self Care | Payer: Medicare PPO | Source: Ambulatory Visit | Attending: Internal Medicine | Admitting: Internal Medicine

## 2020-05-20 ENCOUNTER — Other Ambulatory Visit: Payer: Self-pay

## 2020-05-20 DIAGNOSIS — J849 Interstitial pulmonary disease, unspecified: Secondary | ICD-10-CM | POA: Diagnosis not present

## 2020-05-20 NOTE — Progress Notes (Signed)
Daily Session Note  Patient Details  Name: ALEE KATEN MRN: [id number] Date of Birth: 1946/01/29 Referring Provider:     Pulmonary Rehab Walk Test from 04/14/2020 in [place]  Referring Provider Brand Males      Encounter Date: 05/20/2020  Check In:  Session Check In - 05/20/20 1028      Check-In   Supervising physician immediately available to respond to emergencies [facility] immediately available    Physician(s) Dr. Dessa Phi    Location [facility]    Staff Present Maurice Small, RN, Bjorn Loser, MS, CEP, Exercise Physiologist;Lisa Jani Gravel, MS, ACSM-CEP, Exercise Physiologist    Virtual Visit No    Medication changes reported     No    Fall or balance concerns reported    No    Tobacco Cessation No Change    Warm-up and Cool-down Performed on first and last piece of equipment    Resistance Training Performed Yes    VAD Patient? No    PAD/SET Patient? No      Pain Assessment   Currently in Pain? No/denies    Pain Score 0-No pain    Multiple Pain Sites No           Capillary Blood Glucose: No results found for this or any previous visit (from the past 24 hour(s)).    Social History   Tobacco Use  Smoking Status Former Smoker  . Types: Cigarettes  . Quit date: 12/17/1988  . Years since quitting: 31.4  Smokeless Tobacco Never Used  Tobacco Comment   quit 1980    Goals Met:  Proper associated with RPD/PD & O2 Sat Exercise tolerated well No report of cardiac concerns or symptoms Strength training completed today  Goals Unmet:  Not Applicable  Comments: Service time is from 1005 to 1109    Dr. Fransico Him is Medical Director for Cardiac Rehab at [facility].

## 2020-05-20 NOTE — Progress Notes (Signed)
Steven Pitts came in to Pulmonary rehab today with his new oxygen tanks. He had a small e tank. [name] states that they delivered the concentrator that goes up to 10 liters,but gave him just the 2 small e tanks. I called [employer] to discuss this with them. They assured me that they would have large e tanks delivered to his home and a double portable carrier for his tanks. I notified pt that I had spoke with [employer].

## 2020-05-22 DIAGNOSIS — I5032 Chronic diastolic (congestive) heart failure: Secondary | ICD-10-CM | POA: Diagnosis not present

## 2020-05-22 DIAGNOSIS — R0902 Hypoxemia: Secondary | ICD-10-CM | POA: Diagnosis not present

## 2020-05-22 DIAGNOSIS — R0600 Dyspnea, unspecified: Secondary | ICD-10-CM | POA: Diagnosis not present

## 2020-05-22 DIAGNOSIS — Z77098 Contact with and (suspected) exposure to other hazardous, chiefly nonmedicinal, chemicals: Secondary | ICD-10-CM | POA: Diagnosis not present

## 2020-05-22 DIAGNOSIS — R9389 Abnormal findings on diagnostic imaging of other specified body structures: Secondary | ICD-10-CM | POA: Diagnosis not present

## 2020-05-25 ENCOUNTER — Telehealth (HOSPITAL_COMMUNITY): Payer: Self-pay | Admitting: Internal Medicine

## 2020-05-25 ENCOUNTER — Encounter (HOSPITAL_COMMUNITY): Payer: Medicare PPO

## 2020-05-25 DIAGNOSIS — M25561 Pain in right knee: Secondary | ICD-10-CM | POA: Diagnosis not present

## 2020-05-25 DIAGNOSIS — E669 Obesity, unspecified: Secondary | ICD-10-CM | POA: Diagnosis not present

## 2020-05-25 DIAGNOSIS — E1140 Type 2 diabetes mellitus with diabetic neuropathy, unspecified: Secondary | ICD-10-CM | POA: Diagnosis not present

## 2020-05-25 DIAGNOSIS — K219 Gastro-esophageal reflux disease without esophagitis: Secondary | ICD-10-CM | POA: Diagnosis not present

## 2020-05-25 DIAGNOSIS — F329 Major depressive disorder, single episode, unspecified: Secondary | ICD-10-CM | POA: Diagnosis not present

## 2020-05-25 DIAGNOSIS — I130 Hypertensive heart and chronic kidney disease with heart failure and stage 1 through stage 4 chronic kidney disease, or unspecified chronic kidney disease: Secondary | ICD-10-CM | POA: Diagnosis not present

## 2020-05-25 DIAGNOSIS — E785 Hyperlipidemia, unspecified: Secondary | ICD-10-CM | POA: Diagnosis not present

## 2020-05-25 DIAGNOSIS — I739 Peripheral vascular disease, unspecified: Secondary | ICD-10-CM | POA: Diagnosis not present

## 2020-05-25 DIAGNOSIS — G20 Parkinson's disease: Secondary | ICD-10-CM | POA: Diagnosis not present

## 2020-05-26 ENCOUNTER — Ambulatory Visit: Payer: Medicare PPO | Admitting: Pharmacist

## 2020-05-26 ENCOUNTER — Other Ambulatory Visit: Payer: Self-pay

## 2020-05-26 DIAGNOSIS — J84112 Idiopathic pulmonary fibrosis: Secondary | ICD-10-CM

## 2020-05-26 DIAGNOSIS — Z79899 Other long term (current) drug therapy: Secondary | ICD-10-CM

## 2020-05-26 MED ORDER — ESBRIET 267 MG PO CAPS: ORAL_CAPSULE | ORAL | 0 refills | Status: AC

## 2020-05-26 MED ORDER — ESBRIET 267 MG PO CAPS: 3.0000 | ORAL_CAPSULE | Freq: Three times a day (TID) | ORAL | 0 refills | Status: DC

## 2020-05-26 NOTE — Progress Notes (Signed)
HPI  Patient presents today to [facility] for Initial appt with pharmacy team for [place]. Pertinent past medical history includes IPF, history of tobacco abuse, OSA, type 2 diabetes, Parkinson's disease, HTN . He is naive to anti-fibrotic therapy. Started Spiriva about 2 weeks ago and notices improvement in his shortness of breath.  OBJECTIVE Allergies  Allergen Reactions  . Gadolinium Derivatives Other (See Comments)    Patient sneezed immediately after administering gadolinium/ no treatment/ pt had no other reaction//jv    Outpatient Encounter Medications as of 05/26/2020  Medication Sig  . ADVAIR DISKUS 250-50 MCG/DOSE AEPB Inhale 1 puff into the lungs 2 (two) times daily.  Marland Kitchen albuterol (VENTOLIN HFA) 108 (90 Base) MCG/ACT inhaler INHALE 2 PUFFS EVERY 4-6 HOURS AS NEEDED FOR COUGH/SHORTNESS OF BREATH  . Armodafinil 150 MG tablet Take 150 mg by mouth every morning.  Marland Kitchen aspirin EC 81 MG tablet Take 1 tablet (81 mg total) by mouth daily.  Marland Kitchen atorvastatin (LIPITOR) 20 MG tablet Take 20 mg by mouth daily.  . carbidopa-levodopa (SINEMET IR) 25-100 MG tablet TAKE 1 TABLET BY MOUTH 5 TIMES PER DAY  . DULoxetine (CYMBALTA) 60 MG capsule Take 60 mg by mouth daily.  . empagliflozin (JARDIANCE) 25 MG TABS tablet Take 25 mg by mouth daily.  . furosemide (LASIX) 20 MG tablet TAKE 1 TABLET BY MOUTH DAILY  . gabapentin (NEURONTIN) 300 MG capsule Take by mouth.  Marland Kitchen HYDROcodone-acetaminophen (NORCO) 10-325 MG per tablet Take 10-325 tablets by mouth daily.  . insulin glargine (LANTUS) 100 UNIT/ML injection Inject 30 Units into the skin daily.  . insulin lispro (HUMALOG) 100 UNIT/ML KwikPen INJECT 14 UNITS AT BREAKFAST AND 10 UNITS AT LUNCH AND SUPPER UNDER THE SKIN AS DIRECTED  . metFORMIN (GLUCOPHAGE) 1000 MG tablet Take 1,000 mg by mouth daily.  Marland Kitchen NEXIUM 40 MG capsule Take 40 mg by mouth daily.  Marland Kitchen NOVOFINE PLUS 32G X 4 MM MISC USE WITH TOUJEO AND HUMALOG PEND FOUR TIMES DAILY  . propranolol  (INDERAL) 40 MG tablet Take 40 mg by mouth 2 (two) times daily.   . Ropinirole HCl 12 MG TB24 TAKE ONE (1) TABLET BY MOUTH EACH DAY  . telmisartan-hydrochlorothiazide (MICARDIS HCT) 80-25 MG per tablet Take 1 tablet by mouth daily.   . Tiotropium Bromide Monohydrate (SPIRIVA RESPIMAT) 2.5 MCG/ACT AERS Inhale 2 puffs into the lungs daily.   No facility-administered encounter medications on file as of 05/26/2020.     Immunization History  Administered Date(s) Administered  . PFIZER SARS-COV-2 Vaccination 11/03/2019, 11/26/2019     PFT's TLC  Date Value Ref Range Status  02/24/2020 4.82 L Final     CMP     Component Value Date/Time   NA 137 01/07/2020 0807   K 4.2 01/07/2020 0807   CL 99 01/07/2020 0807   CO2 25 01/07/2020 0807   GLUCOSE 228 (H) 01/07/2020 0807   GLUCOSE 120 (H) 08/19/2008 1530   BUN 19 01/07/2020 0807   CREATININE 1.15 01/07/2020 0807   CALCIUM 9.8 01/07/2020 0807   PROT 6.8 05/11/2020 1225   ALBUMIN 3.9 05/11/2020 1225   AST 25 05/11/2020 1225   ALT 10 05/11/2020 1225   ALKPHOS 62 05/11/2020 1225   BILITOT 0.7 05/11/2020 1225   GFRNONAA 64 01/07/2020 0807   GFRAA 74 01/07/2020 0807     CBC    Component Value Date/Time   HGB 13.8 08/24/2008 0902     LFT's Hepatic Function Latest Ref Rng & Units 05/11/2020  Total  Protein 6.0 - 8.3 g/dL 6.8  Albumin 3.5 - 5.2 g/dL 3.9  AST 0 - 37 U/L 25  ALT 0 - 53 U/L 10  Alk Phosphatase 39 - 117 U/L 62  Total Bilirubin 0.2 - 1.2 mg/dL 0.7  Bilirubin, Direct 0.0 - 0.3 mg/dL 0.2      ASSESSMENT  1. Esbriet Medication Management  Patient counseled on purpose, proper use, and potential adverse effects including nausea, vomiting, abdominal pain, GERD, weight loss, arthralgia, dizziness, and suns sensitivity/rash.  Stressed the importance of routine lab monitoring. Will monitor LFT's every month for the first 6 months of treatment then every 3 months. Will monitor CBC every 3 months.  Starting dose will be  Esbriet 267 mg 1 tablet three times daily for 7 days, then 2 tablets three times daily for 7 days, then 3 tablets three times daily.  Maintenance dose will be 801 mg 1 tablet three times daily if tolerated.  Stressed the importance of taking with meals to minimize stomach upset.    Esbriet was approved through insurance and has a zero dollar co-pay.  Prescription sent to [facility] with shipment scheduled for Tuesday 9/7. Reviewed specialty refill process and address confirmed.  2. Medication Reconciliation  A drug regimen assessment was performed, including review of allergies, interactions, disease-state management, dosing and immunization history. Medications were reviewed with the patient, including name, instructions, indication, goals of therapy, potential side effects, importance of adherence, and safe use.  Drug interaction(s): no major drug interactions   PLAN  Start Esbriet.  Starter dose and maintenance dosing for 30 day supply sent to [facility].  Hepatic panel every month for 6 months then every 3 months.  Standing order placed.  CBC every 3 months.  Standing order placed.  All questions encouraged and answered.  Instructed patient to call with any further questions or concerns.  Thank you for allowing pharmacy to participate in this patient's care.  This appointment required  60 minutes of patient care (this includes precharting, chart review, review of results, face-to-face care, etc.).    Mariella Saa, PharmD, [place], CPP Clinical Specialty Pharmacist (Rheumatology and Pulmonology)  05/26/2020 3:29 PM

## 2020-05-26 NOTE — Patient Instructions (Signed)
Thank you for meeting with the pharmacy team today!  Below find a summary of what we discussed at your visit: . Start Esbriet starter titration . Blood work every month for the first 6 months o 8:30-1 and 2-5 no appointment needed; check in at front desk . Call if you have any side effects to talk about management strategies  Call [phone] with any questions or concerns.  Mariella Saa, PharmD, [place], CPP Clinical Specialty Pharmacist (Rheumatology and Pulmonology)  05/26/2020 2:04 PM

## 2020-05-27 ENCOUNTER — Encounter (HOSPITAL_COMMUNITY)
Admission: RE | Admit: 2020-05-27 | Discharge: 2020-05-27 | Disposition: A | Discharge status: Home / Self Care | Payer: Medicare PPO | Source: Ambulatory Visit | Attending: Internal Medicine | Admitting: Internal Medicine

## 2020-05-27 DIAGNOSIS — J849 Interstitial pulmonary disease, unspecified: Secondary | ICD-10-CM | POA: Diagnosis not present

## 2020-05-27 MED FILL — ESBRIET 267 MG CAPSULE: 267 | 207 days supply | Qty: 207 | Fill #0

## 2020-05-27 NOTE — Progress Notes (Signed)
Bill came in to Pulmonary Rehab today for exercise. He still has the small oxygen tanks and he has not received any calls from [employer] his home oxygen carrier. I called them while he was here exercising. They said that he is set for delivery of the large e tanks today. I called [employer] last week 8/26 to report that the small  tanks would not work for him needing 6-8 liters of portable oxygen. They assured me that this situation would be taken care.I voiced that it is hard to believe that it has taken a week and another phone call to amend this situation. I gave [name] their phone number to call this if his tanks are not delivered this afternoon.

## 2020-05-27 NOTE — Progress Notes (Signed)
Daily Session Note  Patient Details  Name: Steven Pitts MRN: [id number] Date of Birth: Feb 16, 1946 Referring Provider:     Pulmonary Rehab Walk Test from 04/14/2020 in [place]  Referring Provider Brand Males      Encounter Date: 05/27/2020  Check In:  Session Check In - 05/27/20 1256      Check-In   Supervising physician immediately available to respond to emergencies [facility] immediately available    Physician(s) Dr. Cathlean Sauer    [facility]    Staff Present Hoy Register, MS, CEP, Exercise Physiologist;Becci Batty Ysidro Evert, RN;David Makemson, MS, EP-C, CCRP    Virtual Visit No    Medication changes reported     No    Fall or balance concerns reported    No    Tobacco Cessation No Change    Warm-up and Cool-down Performed on first and last piece of equipment    Resistance Training Performed Yes    PAD/SET Patient? No      Pain Assessment   Currently in Pain? No/denies    Multiple Pain Sites No           Capillary Blood Glucose: No results found for this or any previous visit (from the past 24 hour(s)).    Social History   Tobacco Use  Smoking Status Former Smoker  . Types: Cigarettes  . Quit date: 12/17/1988  . Years since quitting: 31.4  Smokeless Tobacco Never Used  Tobacco Comment   quit 1980    Goals Met:  Exercise tolerated well No report of cardiac concerns or symptoms Strength training completed today  Goals Unmet:  Not Applicable  Comments: Service time is from 1000 to 1101    Dr. Fransico Him is Medical Director for Cardiac Rehab at [facility].

## 2020-06-01 ENCOUNTER — Encounter (HOSPITAL_COMMUNITY)
Admission: RE | Admit: 2020-06-01 | Discharge: 2020-06-01 | Disposition: A | Discharge status: Home / Self Care | Payer: Medicare PPO | Source: Ambulatory Visit | Attending: Internal Medicine | Admitting: Internal Medicine

## 2020-06-01 ENCOUNTER — Other Ambulatory Visit: Payer: Self-pay

## 2020-06-01 VITALS — Wt 200.6 lb

## 2020-06-01 DIAGNOSIS — J849 Interstitial pulmonary disease, unspecified: Secondary | ICD-10-CM

## 2020-06-01 NOTE — Progress Notes (Signed)
Daily Session Note  Patient Details  Name: Steven Pitts MRN: [id number] Date of Birth: Jan 09, 1946 Referring Provider:     Pulmonary Rehab Walk Test from 04/14/2020 in [place]  Referring Provider Brand Males      Encounter Date: 06/01/2020  Check In:  Session Check In - 06/01/20 1117      Check-In   Supervising physician immediately available to respond to emergencies [facility] immediately available    Physician(s) Dr. Cathlean Sauer    [facility]    Staff Present Maurice Small, RN, BSN;Lisa Ysidro Evert, RN;David [place], MS, EP-C, CCRP;Kenric Ginger Hassell Done, MS, ACSM-CEP, Exercise Physiologist    Virtual Visit No    Medication changes reported     No    Fall or balance concerns reported    No    Tobacco Cessation No Change    Warm-up and Cool-down Performed on first and last piece of equipment    Resistance Training Performed Yes    VAD Patient? No    PAD/SET Patient? No      Pain Assessment   Currently in Pain? No/denies    Pain Score 0-No pain    Multiple Pain Sites No           Capillary Blood Glucose: No results found for this or any previous visit (from the past 24 hour(s)).  POCT Glucose - 06/01/20 1249      POCT Blood Glucose   Pre-Exercise 108 mg/dL    Post-Exercise 144 mg/dL           Exercise Prescription Changes - 06/01/20 1200      Response to Exercise   Blood Pressure (Admit) 120/70    Blood Pressure (Exercise) 138/70    Blood Pressure (Exit) 130/70    Heart Rate (Admit) 83 bpm    Heart Rate (Exercise) 81 bpm    Heart Rate (Exit) 79 bpm    Oxygen Saturation (Admit) 92 %    Oxygen Saturation (Exercise) 90 %    Oxygen Saturation (Exit) 96 %    Rating of Perceived Exertion (Exercise) 11    Perceived Dyspnea (Exercise) 1    Duration Continue with 30 min of aerobic exercise without signs/symptoms of physical distress.    Intensity THRR unchanged      Progression   Progression  Continue to progress workloads to maintain intensity without signs/symptoms of physical distress.    Average METs 2.2      Resistance Training   Training Prescription Yes    Weight blue bands    Reps 10-15    Time 10 Minutes      Oxygen   Oxygen Continuous    Liters 6      NuStep   Level 2    SPM 80    Minutes 30    METs 2.2           Social History   Tobacco Use  Smoking Status Former Smoker  . Types: Cigarettes  . Quit date: 12/17/1988  . Years since quitting: 31.4  Smokeless Tobacco Never Used  Tobacco Comment   quit 1980    Goals Met:  Proper associated with RPD/PD & O2 Sat Exercise tolerated well No report of cardiac concerns or symptoms Strength training completed today  Goals Unmet:  Not Applicable  Comments: Service time is from 1005 to 1104    Dr. Fransico Him is Medical Director for Cardiac Rehab at [facility].

## 2020-06-01 NOTE — Progress Notes (Signed)
Steven Pitts came in for pulmonary rehab today and he still has the small oxygen tanks. He has not had any large tanks delivered to him from [employer] his oxygen company. He followed my instructions and called them himself 9/2 and 9/3 both times his call was cut off and he was not able to speak with anyone. I called [employer] again today to find out why he is not getting his large oxygen tanks delivered to him. I was assured that they were going to take care of this issue and call the patient with his delivery time.

## 2020-06-01 NOTE — Progress Notes (Signed)
Nutrition note - Follow Up  Spoke with pt during exercise. He reports maintaining a healthy diet.  He continues to eat nutrient dense foods such as whole grains and starchy veggies.  His most recent A1C was 6.0. His CBGs continue to be WNL. He says he started a MVI with iron. We reviewed iron rich foods.  He is amenable to trying to add in some non starchy veggies.   Will continue to monitor during pulmonary rehab.  Michaele Offer, MS, RDN, LDN

## 2020-06-02 ENCOUNTER — Encounter (HOSPITAL_COMMUNITY): Payer: Self-pay

## 2020-06-03 ENCOUNTER — Encounter (HOSPITAL_COMMUNITY)
Admission: RE | Admit: 2020-06-03 | Discharge: 2020-06-03 | Disposition: A | Discharge status: Home / Self Care | Payer: Medicare PPO | Source: Ambulatory Visit | Attending: Internal Medicine | Admitting: Internal Medicine

## 2020-06-03 ENCOUNTER — Other Ambulatory Visit: Payer: Self-pay

## 2020-06-03 DIAGNOSIS — J849 Interstitial pulmonary disease, unspecified: Secondary | ICD-10-CM | POA: Diagnosis not present

## 2020-06-03 NOTE — Progress Notes (Signed)
Pulmonary Individual Treatment Plan  Patient Details  Name: JARIN CORNFIELD MRN: [id number] Date of Birth: 18-Dec-1945 Referring Provider:     Pulmonary Rehab Walk Test from 04/14/2020 in [place]  Referring Provider Brand Males      Initial Encounter Date:    Pulmonary Rehab Walk Test from 04/14/2020 in [place]  Date 04/14/20      Visit Diagnosis: Interstitial lung disease ([place])  Patient's Home Medications on Admission:   Current Outpatient Medications:  .  ADVAIR DISKUS 250-50 MCG/DOSE AEPB, Inhale 1 puff into the lungs 2 (two) times daily., Disp: , Rfl:  .  albuterol (VENTOLIN HFA) 108 (90 Base) MCG/ACT inhaler, INHALE 2 PUFFS EVERY 4-6 HOURS AS NEEDED FOR COUGH/SHORTNESS OF BREATH, Disp: , Rfl:  .  Armodafinil 150 MG tablet, Take 150 mg by mouth every morning., Disp: , Rfl:  .  aspirin EC 81 MG tablet, Take 1 tablet (81 mg total) by mouth daily., Disp: 90 tablet, Rfl: 3 .  atorvastatin (LIPITOR) 20 MG tablet, Take 20 mg by mouth daily., Disp: , Rfl:  .  augmented betamethasone dipropionate (DIPROLENE-AF) 0.05 % ointment, Apply topically 2 (two) times daily., Disp: , Rfl:  .  carbidopa-levodopa (SINEMET IR) 25-100 MG tablet, TAKE 1 TABLET BY MOUTH 5 TIMES PER DAY, Disp: 150 tablet, Rfl: 5 .  DULoxetine (CYMBALTA) 60 MG capsule, Take 60 mg by mouth daily., Disp: , Rfl:  .  empagliflozin (JARDIANCE) 25 MG TABS tablet, Take 25 mg by mouth daily., Disp: , Rfl:  .  furosemide (LASIX) 20 MG tablet, TAKE 1 TABLET BY MOUTH DAILY, Disp: 90 tablet, Rfl: 1 .  gabapentin (NEURONTIN) 300 MG capsule, Take 300 mg by mouth 2 (two) times daily. , Disp: , Rfl:  .  HYDROcodone-acetaminophen (NORCO) 10-325 MG per tablet, Take 10-325 tablets by mouth 3 (three) times daily as needed. , Disp: , Rfl:  .  insulin lispro (HUMALOG) 100 UNIT/ML KwikPen, INJECT 14 UNITS AT BREAKFAST AND 10 UNITS AT LUNCH AND SUPPER UNDER THE SKIN AS  DIRECTED, Disp: , Rfl:  .  metFORMIN (GLUCOPHAGE) 1000 MG tablet, Take 1,000 mg by mouth 2 (two) times daily. , Disp: , Rfl:  .  Multiple Vitamin (MULTIVITAMIN WITH MINERALS) TABS tablet, Take 1 tablet by mouth daily., Disp: , Rfl:  .  NEXIUM 40 MG capsule, Take 40 mg by mouth daily., Disp: , Rfl:  .  Pirfenidone (ESBRIET) 267 MG CAPS, Take 1 capsule by mouth with breakfast, with lunch, and with evening meal for 7 days, THEN 2 capsules with breakfast, with lunch, and with evening meal for 7 days, THEN 3 capsules with breakfast, with lunch, and with evening meal for 16 days., Disp: 207 capsule, Rfl: 0 .  Pirfenidone (ESBRIET) 267 MG CAPS, Take 3 capsules by mouth with breakfast, with lunch, and with evening meal., Disp: 270 capsule, Rfl: 0 .  propranolol (INDERAL) 40 MG tablet, Take 40 mg by mouth 2 (two) times daily. , Disp: , Rfl:  .  Ropinirole HCl 12 MG TB24, TAKE ONE (1) TABLET BY MOUTH EACH DAY, Disp: 90 tablet, Rfl: 1 .  telmisartan-hydrochlorothiazide (MICARDIS HCT) 80-25 MG per tablet, Take 1 tablet by mouth daily. , Disp: , Rfl:  .  Tiotropium Bromide Monohydrate (SPIRIVA RESPIMAT) 2.5 MCG/ACT AERS, Inhale 2 puffs into the lungs daily., Disp: 4 g, Rfl: 5 .  TOUJEO SOLOSTAR 300 UNIT/ML Solostar Pen, Inject 40 Units into the skin daily., Disp: ,  Rfl:   Past Medical History: Past Medical History:  Diagnosis Date  . Abnormality of gait   . Anxiety   . Diabetes ([place])   . Essential and other specified forms of tremor   . High blood pressure   . Other speech disturbance(784.59)   . Parkinson's disease ([place])     Tobacco Use: Social History   Tobacco Use  Smoking Status Former Smoker  . Types: Cigarettes  . Quit date: 12/17/1988  . Years since quitting: 31.4  Smokeless Tobacco Never Used  Tobacco Comment   quit 1980    Labs: Recent Review Flowsheet Data   There is no flowsheet data to display.     Capillary Blood Glucose: Lab Results  Component Value Date   GLUCAP 117 (H)  08/24/2008   GLUCAP 116 (H) 08/24/2008    POCT Glucose    Row Name 04/20/20 1210 05/04/20 1130 05/18/20 1122 06/01/20 1249       POCT Blood Glucose   Pre-Exercise 111 mg/dL -- -- 108 mg/dL    Post-Exercise 169 mg/dL -- -- 144 mg/dL    Pre-Exercise #2 -- 110 mg/dL -- --    Post-Exercise #2 -- 144 mg/dL -- --    Pre-Exercise #3 -- -- 110 mg/dL --    Post-Exercise #3 -- -- 175 mg/dL --           Pulmonary Assessment Scores:  Pulmonary Assessment Scores    Row Name 04/14/20 1012 04/20/20 1517       ADL UCSD   ADL Phase Entry Entry    SOB Score total -- 58      CAT Score   CAT Score -- 19      mMRC Score   mMRC Score 2 --          UCSD: Self-administered rating of dyspnea associated with activities of daily living (ADLs) 6-point scale (0 = "not at all" to 5 = "maximal or unable to do because of breathlessness")  Scoring Scores range from 0 to 120.  Minimally important difference is 5 units  CAT: CAT can identify the health impairment of COPD patients and is better correlated with disease progression.  CAT has a scoring range of zero to 40. The CAT score is classified into four groups of low (less than 10), medium (10 - 20), high (21-30) and very high (31-40) based on the impact level of disease on health status. A CAT score over 10 suggests significant symptoms.  A worsening CAT score could be explained by an exacerbation, poor medication adherence, poor inhaler technique, or progression of COPD or comorbid conditions.  CAT MCID is 2 points  mMRC: mMRC (Modified Medical Research Council) Dyspnea Scale is used to assess the degree of baseline functional disability in patients of respiratory disease due to dyspnea. No minimal important difference is established. A decrease in score of 1 point or greater is considered a positive change.   Pulmonary Function Assessment:  Pulmonary Function Assessment - 04/14/20 0923      Breath   Bilateral Breath Sounds Clear     Shortness of Breath Limiting activity;Fear of Shortness of Breath;Yes           Exercise Target Goals: Exercise Program Goal: Individual exercise prescription set using results from initial 6 min walk test and THRR while considering  patient's activity barriers and safety.   Exercise Prescription Goal: Initial exercise prescription builds to 30-45 minutes a day of aerobic activity, 2-3 days per week.  Home exercise  guidelines will be given to patient during program as part of exercise prescription that the participant will acknowledge.  Activity Barriers & Risk Stratification:  Activity Barriers & Cardiac Risk Stratification - 04/14/20 0917      Activity Barriers & Cardiac Risk Stratification   Activity Barriers Neck/Spine Problems;Arthritis;Back Problems;Deconditioning;Muscular Weakness;Shortness of Breath;History of Falls;Assistive Device;Balance Concerns           6 Minute Walk:  6 Minute Walk    Row Name 04/14/20 1012         6 Minute Walk   Phase Initial     Distance 592 feet     Walk Time 6 minutes     # of Rest Breaks 1  took break to readjust SpO2 monitor on fingers. Aprrox. 30 seconds     MPH 1.12     METS 1.17     RPE 13     Perceived Dyspnea  1     VO2 Peak 4.09     Symptoms Yes (comment)     Comments SOB PPD = 1. No other complaints.     Resting HR 69 bpm     Resting BP 112/64     Resting Oxygen Saturation  95 %     Exercise Oxygen Saturation  during 6 min walk 84 %     Max Ex. HR 79 bpm     Max Ex. BP 116/70     2 Minute Post BP 110/70       Interval HR   1 Minute HR 77     2 Minute HR 79     3 Minute HR 77     4 Minute HR 76     5 Minute HR 77     6 Minute HR 86     2 Minute Post HR 67     Interval Heart Rate? Yes       Interval Oxygen   Interval Oxygen? Yes     Baseline Oxygen Saturation % 95 %     1 Minute Oxygen Saturation % 89 %     1 Minute Liters of Oxygen 3 L     2 Minute Oxygen Saturation % 85 %  Increased to 4L/min at min 2     2  Minute Liters of Oxygen 4 L     3 Minute Oxygen Saturation % 84 %     3 Minute Liters of Oxygen 4 L     4 Minute Oxygen Saturation % 87 %     4 Minute Liters of Oxygen 6 L     5 Minute Oxygen Saturation % 92 %     5 Minute Liters of Oxygen 6 L     6 Minute Oxygen Saturation % 90 %     6 Minute Liters of Oxygen 6 L     2 Minute Post Oxygen Saturation % 95 %     2 Minute Post Liters of Oxygen 3 L            Oxygen Initial Assessment:  Oxygen Initial Assessment - 04/14/20 0922      Home Oxygen   Home Oxygen Device Home Concentrator;Portable Concentrator    Sleep Oxygen Prescription CPAP    Liters per minute 3    Home Exercise Oxygen Prescription Pulsed    Liters per minute 3    Home at Rest Exercise Oxygen Prescription Pulsed    Liters per minute 3    Compliance with Home Oxygen Use Yes  Initial 6 min Walk   Oxygen Used Continuous    Liters per minute 6      Program Oxygen Prescription   Program Oxygen Prescription Continuous    Liters per minute 4   3-4 L/min with nustep     Intervention   Short Term Goals To learn and exhibit compliance with exercise, home and travel O2 prescription;To learn and understand importance of monitoring SPO2 with pulse oximeter and demonstrate accurate use of the pulse oximeter.;To learn and understand importance of maintaining oxygen saturations>88%;To learn and demonstrate proper pursed lip breathing techniques or other breathing techniques.;To learn and demonstrate proper use of respiratory medications    Long  Term Goals Exhibits compliance with exercise, home and travel O2 prescription;Verbalizes importance of monitoring SPO2 with pulse oximeter and return demonstration;Maintenance of O2 saturations>88%;Exhibits proper breathing techniques, such as pursed lip breathing or other method taught during program session;Compliance with respiratory medication;Demonstrates proper use of MDI's           Oxygen Re-Evaluation:  Oxygen  Re-Evaluation    Row Name 05/04/20 1140 05/05/20 1031           Program Oxygen Prescription   Program Oxygen Prescription Continuous;E-Tanks --      Liters per minute 6 --        Home Oxygen   Home Oxygen Device Home Concentrator;Portable Concentrator --      Sleep Oxygen Prescription CPAP --      Liters per minute 3 --      Home Exercise Oxygen Prescription Pulsed --      Liters per minute 5 --      Home at Rest Exercise Oxygen Prescription Pulsed --      Liters per minute 5 --      Compliance with Home Oxygen Use Yes --        Goals/Expected Outcomes   Short Term Goals To learn and exhibit compliance with exercise, home and travel O2 prescription;To learn and understand importance of monitoring SPO2 with pulse oximeter and demonstrate accurate use of the pulse oximeter.;To learn and understand importance of maintaining oxygen saturations>88%;To learn and demonstrate proper pursed lip breathing techniques or other breathing techniques.;To learn and demonstrate proper use of respiratory medications --      Long  Term Goals Exhibits compliance with exercise, home and travel O2 prescription;Verbalizes importance of monitoring SPO2 with pulse oximeter and return demonstration;Maintenance of O2 saturations>88%;Exhibits proper breathing techniques, such as pursed lip breathing or other method taught during program session;Compliance with respiratory medication;Demonstrates proper use of MDI's --      Comments We have discussed with pt about switching from a POC to E tanks. He currently uses 5L pulsed on his POC, which is not enough when he is active and exercising. He arrives to rehab with his SpO2 in the low 80's. He is thinking about switching to etanks so he can receive 6L of continuous flow. We have discussed with pt about switching from a POC to E tanks. He currently uses 5L pulsed on his POC, which is not enough when he is active and exercising. He arrives to rehab with his SpO2 in the low  80's. He is thinking about switching to etanks so he can receive 6L of continuous flow. Pt has upcoming walk test mon 8/13 to complete for eligibility for home oxygen for [employer].      Goals/Expected Outcomes compliance compliance             Oxygen Discharge (Final Oxygen  Re-Evaluation):  Oxygen Re-Evaluation - 05/05/20 1031      Goals/Expected Outcomes   Comments We have discussed with pt about switching from a POC to E tanks. He currently uses 5L pulsed on his POC, which is not enough when he is active and exercising. He arrives to rehab with his SpO2 in the low 80's. He is thinking about switching to etanks so he can receive 6L of continuous flow. Pt has upcoming walk test mon 8/13 to complete for eligibility for home oxygen for [employer].    Goals/Expected Outcomes compliance           Initial Exercise Prescription:  Initial Exercise Prescription - 04/14/20 1000      Date of Initial Exercise RX and Referring Provider   Date 04/14/20    Referring Provider Brand Males    Expected Discharge Date 06/17/20      Oxygen   Oxygen Continuous    Liters 4      NuStep   Level 1    SPM 65    Minutes 30    METs 1.6      Prescription Details   Frequency (times per week) 2    Duration Progress to 30 minutes of continuous aerobic without signs/symptoms of physical distress      Intensity   THRR 40-80% of Max Heartrate 60-119    Ratings of Perceived Exertion 11-13    Perceived Dyspnea 0-4      Progression   Progression Continue progressive overload as per policy without signs/symptoms or physical distress.      Resistance Training   Training Prescription Yes    Weight Theraband    Reps 10-15           Perform Capillary Blood Glucose checks as needed.  Exercise Prescription Changes:  Exercise Prescription Changes    Row Name 04/20/20 1200 04/27/20 1100 05/04/20 1100 05/13/20 1122 06/01/20 1200     Response to Exercise   Blood Pressure (Admit) 104/60 -- 104/64  124/62 120/70   Blood Pressure (Exercise) 122/70 -- 126/60 -- 138/70   Blood Pressure (Exit) 104/56 -- 112/60 110/68 130/70   Heart Rate (Admit) 61 bpm -- 71 bpm 63 bpm 83 bpm   Heart Rate (Exercise) 73 bpm -- 76 bpm 66 bpm 81 bpm   Heart Rate (Exit) 71 bpm -- 70 bpm 66 bpm 79 bpm   Oxygen Saturation (Admit) 97 % -- 95 % 91 % 92 %   Oxygen Saturation (Exercise) 92 % -- 89 % 93 % 90 %   Oxygen Saturation (Exit) 96 % -- 91 % 93 % 96 %   Rating of Perceived Exertion (Exercise) 9 -- _0 Perceived Dyspnea (Exercise) 1 -- _1 Duration Continue with 30 min of aerobic exercise without signs/symptoms of physical distress. -- Continue with 30 min of aerobic exercise without signs/symptoms of physical distress. Continue with 30 min of aerobic exercise without signs/symptoms of physical distress. Continue with 30 min of aerobic exercise without signs/symptoms of physical distress.   Intensity THRR unchanged -- THRR unchanged THRR unchanged THRR unchanged     Progression   Progression Continue to progress workloads to maintain intensity without signs/symptoms of physical distress. -- Continue to progress workloads to maintain intensity without signs/symptoms of physical distress. Continue to progress workloads to maintain intensity without signs/symptoms of physical distress. Continue to progress workloads to maintain intensity without signs/symptoms of physical distress.   Average METs -- -- -- --  2.2     Resistance Training   Training Prescription Yes -- Yes Yes Yes   Weight blue bands -- blue bands blue bands blue bands   Reps 10-15 -- 10-15 10-15 10-15   Time 10 Minutes -- 10 Minutes 10 Minutes 10 Minutes     Oxygen   Oxygen Continuous -- Continuous Continuous Continuous   Liters 6 -- _0 NuStep   Level 2 -- _1 SPM 80 -- 80 80 80   Minutes 30 -- _2 METs 2.1 -- 2.3 2.2 2.2     Home Exercise Plan   Plans to continue exercise at -- Home (comment) -- -- --    Frequency -- Add 2 additional days to program exercise sessions. -- -- --   Initial Home Exercises Provided -- 04/27/20 -- -- --          Exercise Comments:  Exercise Comments    Row Name 04/20/20 1212 04/27/20 1143 06/01/20 1252       Exercise Comments Pt completed his first day of exercise in pulmonary rehab today. Pt tolerated exercise well with no complaints. home exercise reviewed pt has completed 11 exercise sessions with pulmonary rehab. He stated that he has noticed an improvement with his SOB since his first few sessions. He can now walk from the parking deck to rehab without having to sit down and take a break.            Exercise Goals and Review:  Exercise Goals    Row Name 04/14/20 1021 05/04/20 1142           Exercise Goals   Increase Physical Activity Yes Yes      Intervention Provide advice, education, support and counseling about physical activity/exercise needs.;Develop an individualized exercise prescription for aerobic and resistive training based on initial evaluation findings, risk stratification, comorbidities and participant's personal goals. Provide advice, education, support and counseling about physical activity/exercise needs.;Develop an individualized exercise prescription for aerobic and resistive training based on initial evaluation findings, risk stratification, comorbidities and participant's personal goals.      Expected Outcomes Short Term: Attend rehab on a regular basis to increase amount of physical activity.;Long Term: Add in home exercise to make exercise part of routine and to increase amount of physical activity.;Long Term: Exercising regularly at least 3-5 days a week. Short Term: Attend rehab on a regular basis to increase amount of physical activity.;Long Term: Add in home exercise to make exercise part of routine and to increase amount of physical activity.;Long Term: Exercising regularly at least 3-5 days a week.      Increase Strength and  Stamina Yes Yes      Intervention Provide advice, education, support and counseling about physical activity/exercise needs.;Develop an individualized exercise prescription for aerobic and resistive training based on initial evaluation findings, risk stratification, comorbidities and participant's personal goals. Provide advice, education, support and counseling about physical activity/exercise needs.;Develop an individualized exercise prescription for aerobic and resistive training based on initial evaluation findings, risk stratification, comorbidities and participant's personal goals.      Expected Outcomes Short Term: Increase workloads from initial exercise prescription for resistance, speed, and METs.;Short Term: Perform resistance training exercises routinely during rehab and add in resistance training at home;Long Term: Improve cardiorespiratory fitness, muscular endurance and strength as measured by increased METs and functional capacity (6MWT) Short Term: Increase workloads from initial exercise prescription for resistance, speed, and METs.;Short Term:  Perform resistance training exercises routinely during rehab and add in resistance training at home;Long Term: Improve cardiorespiratory fitness, muscular endurance and strength as measured by increased METs and functional capacity (6MWT)      Able to understand and use rate of perceived exertion (RPE) scale Yes Yes      Intervention Provide education and explanation on how to use RPE scale Provide education and explanation on how to use RPE scale      Expected Outcomes Short Term: Able to use RPE daily in rehab to express subjective intensity level;Long Term:  Able to use RPE to guide intensity level when exercising independently Short Term: Able to use RPE daily in rehab to express subjective intensity level;Long Term:  Able to use RPE to guide intensity level when exercising independently      Able to understand and use Dyspnea scale Yes Yes       Intervention Provide education and explanation on how to use Dyspnea scale Provide education and explanation on how to use Dyspnea scale      Expected Outcomes Short Term: Able to use Dyspnea scale daily in rehab to express subjective sense of shortness of breath during exertion;Long Term: Able to use Dyspnea scale to guide intensity level when exercising independently Short Term: Able to use Dyspnea scale daily in rehab to express subjective sense of shortness of breath during exertion;Long Term: Able to use Dyspnea scale to guide intensity level when exercising independently      Knowledge and understanding of Target Heart Rate Range (THRR) Yes Yes      Intervention Provide education and explanation of THRR including how the numbers were predicted and where they are located for reference Provide education and explanation of THRR including how the numbers were predicted and where they are located for reference      Expected Outcomes Short Term: Able to state/look up THRR;Long Term: Able to use THRR to govern intensity when exercising independently;Short Term: Able to use daily as guideline for intensity in rehab Short Term: Able to state/look up THRR;Long Term: Able to use THRR to govern intensity when exercising independently;Short Term: Able to use daily as guideline for intensity in rehab      Understanding of Exercise Prescription Yes Yes      Intervention Provide education, explanation, and written materials on patient's individual exercise prescription Provide education, explanation, and written materials on patient's individual exercise prescription      Expected Outcomes Short Term: Able to explain program exercise prescription;Long Term: Able to explain home exercise prescription to exercise independently Short Term: Able to explain program exercise prescription;Long Term: Able to explain home exercise prescription to exercise independently             Exercise Goals Re-Evaluation :  Exercise  Goals Re-Evaluation    Row Name 05/04/20 1143             Exercise Goal Re-Evaluation   Exercise Goals Review Increase Physical Activity;Increase Strength and Stamina;Able to understand and use rate of perceived exertion (RPE) scale;Able to understand and use Dyspnea scale;Knowledge and understanding of Target Heart Rate Range (THRR);Understanding of Exercise Prescription       Comments Pt has attended 5 exercise sessions. He has a positive outlook and is eager for workload increases. He has gotten a new home oxygen concentrator that goes up to 10 L, so he can exercise safely at home. His old one only went up to 5 L. He is also going to switch to e  tanks instead of his POC, as his POC does not give him an adequate amount of liter flow. He is currently exercising at 2.3 METs on the stepper. Will continue to monitor and progress as able.       Expected Outcomes Through exercise at rehab and at home, the patient will decrease shortness of breath with daily activities and feel confident in carrying out an exercise regime at home.              Discharge Exercise Prescription (Final Exercise Prescription Changes):  Exercise Prescription Changes - 06/01/20 1200      Response to Exercise   Blood Pressure (Admit) 120/70    Blood Pressure (Exercise) 138/70    Blood Pressure (Exit) 130/70    Heart Rate (Admit) 83 bpm    Heart Rate (Exercise) 81 bpm    Heart Rate (Exit) 79 bpm    Oxygen Saturation (Admit) 92 %    Oxygen Saturation (Exercise) 90 %    Oxygen Saturation (Exit) 96 %    Rating of Perceived Exertion (Exercise) 11    Perceived Dyspnea (Exercise) 1    Duration Continue with 30 min of aerobic exercise without signs/symptoms of physical distress.    Intensity THRR unchanged      Progression   Progression Continue to progress workloads to maintain intensity without signs/symptoms of physical distress.    Average METs 2.2      Resistance Training   Training Prescription Yes    Weight  blue bands    Reps 10-15    Time 10 Minutes      Oxygen   Oxygen Continuous    Liters 6      NuStep   Level 2    SPM 80    Minutes 30    METs 2.2           Nutrition:  Target Goals: Understanding of nutrition guidelines, daily intake of sodium <1539m, cholesterol <2017m calories 30% from fat and 7% or less from saturated fats, daily to have 5 or more servings of fruits and vegetables.  Biometrics:  Pre Biometrics - 04/14/20 0922      Pre Biometrics   Triceps Skinfold 30 mm            Nutrition Therapy Plan and Nutrition Goals:  Nutrition Therapy & Goals - 05/04/20 1424      Nutrition Therapy   Diet Carb modified      Personal Nutrition Goals   Nutrition Goal Pt to build a healthy plate including vegetables, fruits, whole grains, and low-fat dairy products in a healthy meal plan.    Personal Goal #2 Pt to incorporate fiber rich foods daily to reach goal of minimum fiber intake 28 g/day      Intervention Plan   Intervention Nutrition handout(s) given to patient.;Prescribe, educate and counsel regarding individualized specific dietary modifications aiming towards targeted core components such as weight, hypertension, lipid management, diabetes, heart failure and other comorbidities.    Expected Outcomes Short Term Goal: A plan has been developed with personal nutrition goals set during dietitian appointment.;Long Term Goal: Adherence to prescribed nutrition plan.           Nutrition Assessments:  Nutrition Assessments - 05/04/20 1423      Rate Your Plate Scores   Pre Score 59           Nutrition Goals Re-Evaluation:  Nutrition Goals Re-Evaluation    Row Name 05/04/20 1425 06/01/20 0720  Goals   Current Weight 206 lb (93.4 kg) 200 lb 6.4 oz (90.9 kg)      Nutrition Goal Pt to build a healthy plate including vegetables, fruits, whole grains, and low-fat dairy products in a healthy meal plan. --      Comment -- Weight loss 6 lbs in 4 weeks         Personal Goal #2 Re-Evaluation   Personal Goal #2 Pt to incorporate fiber rich foods daily to reach goal of minimum fiber intake 28 g/day --             Nutrition Goals Discharge (Final Nutrition Goals Re-Evaluation):  Nutrition Goals Re-Evaluation - 06/01/20 0720      Goals   Current Weight 200 lb 6.4 oz (90.9 kg)    Comment Weight loss 6 lbs in 4 weeks           Psychosocial: Target Goals: Acknowledge presence or absence of significant depression and/or stress, maximize coping skills, provide positive support system. Participant is able to verbalize types and ability to use techniques and skills needed for reducing stress and depression.  Initial Review & Psychosocial Screening:  Initial Psych Review & Screening - 04/14/20 3729      Initial Review   Current issues with Current Depression;History of Depression   is on cymbalta and it helps keep his depression stable,     Family Dynamics   Good Support System? Yes      Barriers   Psychosocial barriers to participate in program The patient should benefit from training in stress management and relaxation.      Screening Interventions   Interventions Encouraged to exercise    Expected Outcomes --   Has chronic illnesses, unable to perform activities he use to enjoy.          Quality of Life Scores:  Scores of 19 and below usually indicate a poorer quality of life in these areas.  A difference of  2-3 points is a clinically meaningful difference.  A difference of 2-3 points in the total score of the Quality of Life Index has been associated with significant improvement in overall quality of life, self-image, physical symptoms, and general health in studies assessing change in quality of life.  PHQ-9: Recent Review Flowsheet Data    Depression screen [facility] 2/9 04/14/2020   Decreased Interest 0   Down, Depressed, Hopeless 0   PHQ - 2 Score 0   Altered sleeping 0   Tired, decreased energy 0   Change in appetite 0    Feeling bad or failure about yourself  0   Trouble concentrating 1   Moving slowly or fidgety/restless 0   Suicidal thoughts 0   PHQ-9 Score 1   Difficult doing work/chores Not difficult at all     Interpretation of Total Score  Total Score Depression Severity:  1-4 = Minimal depression, 5-9 = Mild depression, 10-14 = Moderate depression, 15-19 = Moderately severe depression, 20-27 = Severe depression   Psychosocial Evaluation and Intervention:  Psychosocial Evaluation - 04/14/20 0927      Psychosocial Evaluation & Interventions   Interventions Stress management education;Relaxation education;Encouraged to exercise with the program and follow exercise prescription    Comments Depression is stable at this point    Continue Psychosocial Services  No Follow up required           Psychosocial Re-Evaluation:  Psychosocial Re-Evaluation    [place] Name 05/05/20 1034 06/02/20 1347 06/02/20 1627  Psychosocial Re-Evaluation   Current issues with History of Depression -- --     Comments Pt with history of depression.  Pt admits to having occasional "blue" day but typically can "shake" it off. Pt feels he has good medical managment of his depression with  cymbalta 60 mg daily. Pt feels supported by his wife. Pt has completed 11 exercise sessions and feels he has good medical managment of his depression with  cymbalta 60 mg daily. Pt feels supported by his wife. However pt feels if he is able to get larger tanks from [place] that he will be able to get out of the house more.  he is quite limited due to needing high liter flow O2 with small tanks that only last about 40 minutes.  Pulmonary staff is reaching out to [place] on his behalf. [name] has completed 11 exercise sessions and feels he has good medical managment of his depression with  cymbalta 60 mg daily. Pt feels supported by his wife. However pt feels if he is able to get larger tanks from [place] that he will be  able to get out of the house more.  he is quite limited due to needing high liter flow O2 with small tanks that only last about 40 minutes.  Pulmonary staff is reaching out to [place] on his behalf.     Expected Outcomes Pt will be free of any identifiable  Psychosocial barriers Pt will continue to display positive and healthy coping skills. Pt will be free of any identifiable  Psychosocial barriers Pt will continue to display positive and healthy coping skills. [name] will be free of any identifiable  Psychosocial barriers Pt will continue to display positive and healthy coping skills.     Interventions Stress management education;Encouraged to attend Pulmonary Rehabilitation for the exercise;Relaxation education Stress management education;Encouraged to attend Pulmonary Rehabilitation for the exercise;Relaxation education --     Continue Psychosocial Services  Follow up required by staff  Will periodically check in with pt for continued mental well being. Follow up required by staff --            Psychosocial Discharge (Final Psychosocial Re-Evaluation):  Psychosocial Re-Evaluation - 06/02/20 1627      Psychosocial Re-Evaluation   Comments [name] has completed 11 exercise sessions and feels he has good medical managment of his depression with  cymbalta 60 mg daily. Pt feels supported by his wife. However pt feels if he is able to get larger tanks from [place] that he will be able to get out of the house more.  he is quite limited due to needing high liter flow O2 with small tanks that only last about 40 minutes.  Pulmonary staff is reaching out to [place] on his behalf.    Expected Outcomes [name] will be free of any identifiable  Psychosocial barriers Pt will continue to display positive and healthy coping skills.           Education: Education Goals: Education classes will be provided on a weekly basis, covering required topics. Participant will state understanding/return  demonstration of topics presented.  Learning Barriers/Preferences:  Learning Barriers/Preferences - 04/14/20 0927      Learning Barriers/Preferences   Learning Barriers --   has a major tremor, his wife assists him in filling out paperwork   Learning Preferences Computer/Internet           Education Topics: Risk Factor Reduction:  -Group instruction that is supported by a PowerPoint  presentation. Instructor discusses the definition of a risk factor, different risk factors for pulmonary disease, and how the heart and lungs work together.     Nutrition for Pulmonary Patient:  -Group instruction provided by PowerPoint slides, verbal discussion, and written materials to support subject matter. The instructor gives an explanation and review of healthy diet recommendations, which includes a discussion on weight management, recommendations for fruit and vegetable consumption, as well as protein, fluid, caffeine, fiber, sodium, sugar, and alcohol. Tips for eating when patients are short of breath are discussed.   PULMONARY REHAB OTHER RESPIRATORY from 06/01/2020 in [place]  Date 05/20/20  Educator Handout  Instruction Review Code 2- Demonstrated Understanding      Pursed Lip Breathing:  -Group instruction that is supported by demonstration and informational handouts. Instructor discusses the benefits of pursed lip and diaphragmatic breathing and detailed demonstration on how to preform both.     Oxygen Safety:  -Group instruction provided by PowerPoint, verbal discussion, and written material to support subject matter. There is an overview of "What is Oxygen" and "Why do we need it".  Instructor also reviews how to create a safe environment for oxygen use, the importance of using oxygen as prescribed, and the risks of noncompliance. There is a brief discussion on traveling with oxygen and resources the patient may utilize.   Oxygen Equipment:  -Group  instruction provided by [facility] Staff utilizing handouts, written materials, and equipment demonstrations.   Signs and Symptoms:  -Group instruction provided by written material and verbal discussion to support subject matter. Warning signs and symptoms of infection, stroke, and heart attack are reviewed and when to call the physician/911 reinforced. Tips for preventing the spread of infection discussed.   Advanced Directives:  -Group instruction provided by verbal instruction and written material to support subject matter. Instructor reviews Advanced Directive laws and proper instruction for filling out document.   Pulmonary Video:  -Group video education that reviews the importance of medication and oxygen compliance, exercise, good nutrition, pulmonary hygiene, and pursed lip and diaphragmatic breathing for the pulmonary patient.   Exercise for the Pulmonary Patient:  -Group instruction that is supported by a PowerPoint presentation. Instructor discusses benefits of exercise, core components of exercise, frequency, duration, and intensity of an exercise routine, importance of utilizing pulse oximetry during exercise, safety while exercising, and options of places to exercise outside of rehab.     Pulmonary Medications:  -Verbally interactive group education provided by instructor with focus on inhaled medications and proper administration.   PULMONARY REHAB OTHER RESPIRATORY from 05/06/2020 in [place]  Date 05/06/20  Educator handout      Anatomy and Physiology of the Respiratory System and Intimacy:  -Group instruction provided by PowerPoint, verbal discussion, and written material to support subject matter. Instructor reviews respiratory cycle and anatomical components of the respiratory system and their functions. Instructor also reviews differences in obstructive and restrictive respiratory diseases with examples of each. Intimacy, Sex, and  Sexuality differences are reviewed with a discussion on how relationships can change when diagnosed with pulmonary disease. Common sexual concerns are reviewed.   MD DAY -A group question and answer session with a medical doctor that allows participants to ask questions that relate to their pulmonary disease state.   OTHER EDUCATION -Group or individual verbal, written, or video instructions that support the educational goals of the pulmonary rehab program.   PULMONARY REHAB OTHER RESPIRATORY from 06/01/2020 in [place]  [place]  Date 06/01/20  [facility]  Educator handout   Instruction Review Code 2- Demonstrated Understanding      Holiday Eating Survival Tips:  -Group instruction provided by PowerPoint slides, verbal discussion, and written materials to support subject matter. The instructor gives patients tips, tricks, and techniques to help them not only survive but enjoy the holidays despite the onslaught of food that accompanies the holidays.   Knowledge Questionnaire Score:  Knowledge Questionnaire Score - 04/20/20 1518      Knowledge Questionnaire Score   Pre Score 16/18           Core Components/Risk Factors/Patient Goals at Admission:  Personal Goals and Risk Factors at Admission - 04/14/20 0929      Core Components/Risk Factors/Patient Goals on Admission   Improve shortness of breath with ADL's Yes    Intervention Provide education, individualized exercise plan and daily activity instruction to help decrease symptoms of SOB with activities of daily living.    Expected Outcomes Short Term: Improve cardiorespiratory fitness to achieve a reduction of symptoms when performing ADLs;Long Term: Be able to perform more ADLs without symptoms or delay the onset of symptoms           Core Components/Risk Factors/Patient Goals Review:   Goals and Risk Factor Review    Row Name 04/14/20 0929 05/05/20 1042 06/02/20 1351 06/02/20 1627       Core  Components/Risk Factors/Patient Goals Review   Personal Goals Review Increase knowledge of respiratory medications and ability to use respiratory devices properly.;Improve shortness of breath with ADL's;Develop more efficient breathing techniques such as purse lipped breathing and diaphragmatic breathing and practicing self-pacing with activity. Increase knowledge of respiratory medications and ability to use respiratory devices properly.;Improve shortness of breath with ADL's;Develop more efficient breathing techniques such as purse lipped breathing and diaphragmatic breathing and practicing self-pacing with activity.;Diabetes Increase knowledge of respiratory medications and ability to use respiratory devices properly.;Improve shortness of breath with ADL's;Develop more efficient breathing techniques such as purse lipped breathing and diaphragmatic breathing and practicing self-pacing with activity.;Diabetes --    Review -- Pt has completed 5 exercsie sessions. Pt reports that he is able shop in large stores with the assistance of shopping cart with less shortness of breath. Pt diabetes is well mangaged with current medicine regimen, diet and effort to increase his activity. Pt has completed 11 exercsie sessions. Demonstrates appropriate breathing techniques such as PLB with verbal cues. Would like to move pt to employ these techniques independently.  Pt diabetes is well mangaged with current medicine regimen, diet and effort to increase his activity. Pulmonary staff contacting [place] for getting larger tanks for him to use so he can go out into the community on his needed 6L without running out of oxygen. [name]  has completed 11 exercsie sessions. Demonstrates appropriate breathing techniques such as PLB with verbal cues. Would like to move pt to employ these techniques independently.  Pt diabetes is well mangaged with current medicine regimen, diet and effort to increase his activity. Pulmonary staff  contacting [place] for getting larger tanks for him to use so he can go out into the community on his needed 6L without running out of oxygen.    Expected Outcomes -- Pt will demonstarte efficient breathing techniques such as PLB and diaphragmatic breahing will pacing himself during activity.  Pt will continue to maintain appropriate managment of his diabetes by compliance with medication, adherence to nutritional reccomendation and identify opportunites to  increase his activity Pt will demonstarte efficient breathing techniques such as PLB and diaphragmatic breahing will pacing himself during activity.  Pt will continue to maintain appropriate managment of his diabetes by compliance with medication, adherence to nutritional reccomendation and identify opportunites to increase his activity See Admission Goals           Core Components/Risk Factors/Patient Goals at Discharge (Final Review):   Goals and Risk Factor Review - 06/02/20 1627      Core Components/Risk Factors/Patient Goals Review   Review [name]  has completed 11 exercsie sessions. Demonstrates appropriate breathing techniques such as PLB with verbal cues. Would like to move pt to employ these techniques independently.  Pt diabetes is well mangaged with current medicine regimen, diet and effort to increase his activity. Pulmonary staff contacting [place] for getting larger tanks for him to use so he can go out into the community on his needed 6L without running out of oxygen.    Expected Outcomes See Admission Goals           ITP Comments:  ITP Comments    Row Name 05/05/20 1026           ITP Comments Dr. Rodman Pickle, Medical Directo for [facility]              Comments: [name] has completed 11 exercise session in Pulmonary rehab. Pt maintains good attendance and consistent home exercise as able due to not having large enough tank for venturing outside. Pulmonary rehab staff will   continue to monitor and reassess progress toward goals during her participation in Pulmonary Rehab. Cherre Huger, BSN Cardiac and Training and development officer

## 2020-06-03 NOTE — Progress Notes (Signed)
Daily Session Note  Patient Details  Name: Steven Pitts MRN: [id number] Date of Birth: 14-Feb-1946 Referring Provider:     Pulmonary Rehab Walk Test from 04/14/2020 in [place]  Referring Provider Brand Males      Encounter Date: 06/03/2020  Check In:  Session Check In - 06/03/20 1148      Check-In   Supervising physician immediately available to respond to emergencies [facility] immediately available    Physician(s) Dr. Cathlean Sauer    Location [facility]    Staff Present Rodney Langton, RN;Jessica Hassell Done, MS, ACSM-CEP, Exercise Physiologist;Jaylan Duggar Rollene Rotunda, RN, BSN    Virtual Visit No    Medication changes reported     No    Fall or balance concerns reported    No    Tobacco Cessation No Change    Warm-up and Cool-down Performed on first and last piece of equipment    Resistance Training Performed Yes    VAD Patient? No    PAD/SET Patient? No      Pain Assessment   Currently in Pain? No/denies    Pain Score 0-No pain    Multiple Pain Sites No           Capillary Blood Glucose: No results found for this or any previous visit (from the past 24 hour(s)).    Social History   Tobacco Use  Smoking Status Former Smoker  . Types: Cigarettes  . Quit date: 12/17/1988  . Years since quitting: 31.4  Smokeless Tobacco Never Used  Tobacco Comment   quit 1980    Goals Met:  Proper associated with RPD/PD & O2 Sat Independence with exercise equipment Using PLB without cueing & demonstrates good technique Exercise tolerated well Strength training completed today  Goals Unmet:  Not Applicable  Comments: Service time is from 1005 to 1115   Dr. Fransico Him is Medical Director for Cardiac Rehab at [facility].

## 2020-06-08 ENCOUNTER — Encounter (HOSPITAL_COMMUNITY)
Admission: RE | Admit: 2020-06-08 | Discharge: 2020-06-08 | Disposition: A | Discharge status: Home / Self Care | Payer: Medicare PPO | Source: Ambulatory Visit | Attending: Internal Medicine | Admitting: Internal Medicine

## 2020-06-08 ENCOUNTER — Other Ambulatory Visit: Payer: Self-pay

## 2020-06-08 DIAGNOSIS — J849 Interstitial pulmonary disease, unspecified: Secondary | ICD-10-CM | POA: Diagnosis not present

## 2020-06-08 NOTE — Progress Notes (Signed)
Daily Session Note  Patient Details  Name: Steven Pitts MRN: [id number] Date of Birth: Mar 05, 1946 Referring Provider:     Pulmonary Rehab Walk Test from 04/14/2020 in [place]  Referring Provider Brand Males      Encounter Date: 06/08/2020  Check In:  Session Check In - 06/08/20 1004      Check-In   Supervising physician immediately available to respond to emergencies [facility] immediately available    Physician(s) Dr. Cathlean Sauer    Location [facility]    Staff Present Maurice Small, RN, Bjorn Loser, MS, CEP, Exercise Physiologist;Starsha Morning Jani Gravel, MS, ACSM-CEP, Exercise Physiologist    Virtual Visit No    Medication changes reported     No    Fall or balance concerns reported    No    Tobacco Cessation No Change    Warm-up and Cool-down Performed on first and last piece of equipment    Resistance Training Performed Yes    VAD Patient? No    PAD/SET Patient? No      Pain Assessment   Currently in Pain? No/denies    Pain Score 0-No pain    Multiple Pain Sites No           Capillary Blood Glucose: No results found for this or any previous visit (from the past 24 hour(s)).    Social History   Tobacco Use  Smoking Status Former Smoker  . Types: Cigarettes  . Quit date: 12/17/1988  . Years since quitting: 31.4  Smokeless Tobacco Never Used  Tobacco Comment   quit 1980    Goals Met:  Exercise tolerated well No report of cardiac concerns or symptoms Strength training completed today  Goals Unmet:  Not Applicable  Comments: Service time is from 1000 to 1105    Dr. Fransico Him is Medical Director for Cardiac Rehab at [facility].

## 2020-06-08 NOTE — Progress Notes (Signed)
I called [facility] again today about this patient. He has not heard anything from them about a delivery schedule. He still has only the 3 large tanks at home for his portable oxygen that is now running low. I explained that he needs portable oxygen to be able to get here to participate in Pulmonary Rehab and that he had many questions that he would like answered about oxygen tanks, supplies and other things about oxygen. The person I spoke with was contacting her supervisor about this situation.

## 2020-06-10 ENCOUNTER — Encounter (HOSPITAL_COMMUNITY)
Admission: RE | Admit: 2020-06-10 | Discharge: 2020-06-10 | Disposition: A | Discharge status: Home / Self Care | Payer: Medicare PPO | Source: Ambulatory Visit | Attending: Internal Medicine | Admitting: Internal Medicine

## 2020-06-10 ENCOUNTER — Other Ambulatory Visit: Payer: Self-pay

## 2020-06-10 DIAGNOSIS — J849 Interstitial pulmonary disease, unspecified: Secondary | ICD-10-CM

## 2020-06-10 NOTE — Progress Notes (Signed)
Daily Session Note  Patient Details  Name: JAMAUL HEIST MRN: [id number] Date of Birth: [birth year] Referring Provider:     Pulmonary Rehab Walk Test from 04/14/2020 in [place]  Referring Provider Brand Males      Encounter Date: 06/10/2020  Check In:  Session Check In - 06/10/20 1016      Check-In   Supervising physician immediately available to respond to emergencies [facility] immediately available    Physician(s) Dr. Teryl Lucy    Location [facility]    Staff Present Maurice Small, RN, BSN;Lisa Ysidro Evert, RN;Haakon Titsworth Hassell Done, MS, ACSM-CEP, Exercise Physiologist    Virtual Visit No    Medication changes reported     No    Fall or balance concerns reported    No    Tobacco Cessation No Change    Warm-up and Cool-down Performed on first and last piece of equipment    Resistance Training Performed Yes    VAD Patient? No    PAD/SET Patient? No      Pain Assessment   Currently in Pain? No/denies    Pain Score 0-No pain    Multiple Pain Sites No           Capillary Blood Glucose: No results found for this or any previous visit (from the past 24 hour(s)).    Social History   Tobacco Use  Smoking Status Former Smoker  . Types: Cigarettes  . Quit date: 12/17/1988  . Years since quitting: 31.5  Smokeless Tobacco Never Used  Tobacco Comment   quit 1980    Goals Met:  Proper associated with RPD/PD & O2 Sat Exercise tolerated well No report of cardiac concerns or symptoms Strength training completed today  Goals Unmet:  Not Applicable  Comments: Service time is from 1000 to 39    Dr. Fransico Him is Medical Director for Cardiac Rehab at [facility].

## 2020-06-14 NOTE — Telephone Encounter (Signed)
After sent message to pt in regards to last office visit with Derl Barrow, NP where she stated for pt to Continue 5 L oxygen on exertion and 3L at bedtime, pt sent mychart response stating that he has been using 6L at rest and 8L when exerting when he has been doing pulmonary rehab.  Pt wants to know which way he is supposed to be doing. Also, ONO was last performed 03/08/20 and per [employer], there is an order that is currently on hold for another ONO.  Pt needs to know if he is needing to have ONO repeated or if the one that was done back in June 2021 is enough.  MR, please advise on all this.

## 2020-06-14 NOTE — Telephone Encounter (Signed)
Called [employer] to see if they have any info on when pt had ONO recently performed.   ONO recently performed 03/08/2020. Order on hold now until 06/15/20 as they are waiting on pt to call them to get shipment arranged for ONO.  Also from last OV, it said for pt to Continue 5 L oxygen on exertion and 3L at bedtime. I have posted all this info to pt in mychart message and for him to contact [employer].

## 2020-06-15 ENCOUNTER — Telehealth: Payer: Self-pay | Admitting: Internal Medicine

## 2020-06-15 ENCOUNTER — Other Ambulatory Visit: Payer: Self-pay

## 2020-06-15 ENCOUNTER — Encounter (HOSPITAL_COMMUNITY)
Admission: RE | Admit: 2020-06-15 | Discharge: 2020-06-15 | Disposition: A | Discharge status: Home / Self Care | Payer: Medicare PPO | Source: Ambulatory Visit | Attending: Internal Medicine | Admitting: Internal Medicine

## 2020-06-15 VITALS — Wt 202.6 lb

## 2020-06-15 DIAGNOSIS — J849 Interstitial pulmonary disease, unspecified: Secondary | ICD-10-CM | POA: Diagnosis not present

## 2020-06-15 NOTE — Progress Notes (Signed)
Daily Session Note  Patient Details  Name: JAYSHAWN COLSTON MRN: [id number] Date of Birth: [birth year] Referring Provider:     Pulmonary Rehab Walk Test from 04/14/2020 in [place]  Referring Provider Brand Males      Encounter Date: 06/15/2020  Check In:  Session Check In - 06/15/20 1057      Check-In   Supervising physician immediately available to respond to emergencies [facility] immediately available    Physician(s) Dr. Lonny Prude    Location [facility]    Staff Present Maurice Small, RN, Bjorn Loser, MS, CEP, Exercise Physiologist;Dametra Whetsel Jani Gravel, MS, ACSM-CEP, Exercise Physiologist    Virtual Visit No    Medication changes reported     No    Fall or balance concerns reported    No    Tobacco Cessation No Change    Warm-up and Cool-down Performed on first and last piece of equipment    Resistance Training Performed Yes    VAD Patient? No    PAD/SET Patient? No      Pain Assessment   Currently in Pain? No/denies    Pain Score 0-No pain    Multiple Pain Sites No           Capillary Blood Glucose: No results found for this or any previous visit (from the past 24 hour(s)).  POCT Glucose - 06/15/20 1431      POCT Blood Glucose   Pre-Exercise 102 mg/dL    Post-Exercise 137 mg/dL           Exercise Prescription Changes - 06/15/20 1400      Response to Exercise   Blood Pressure (Admit) 110/60    Blood Pressure (Exercise) 110/56    Blood Pressure (Exit) 120/60    Heart Rate (Admit) 68 bpm    Heart Rate (Exercise) 77 bpm    Heart Rate (Exit) 72 bpm    Oxygen Saturation (Admit) 96 %    Oxygen Saturation (Exercise) 91 %    Oxygen Saturation (Exit) 92 %    Rating of Perceived Exertion (Exercise) 11    Perceived Dyspnea (Exercise) 1    Duration Continue with 30 min of aerobic exercise without signs/symptoms of physical distress.    Intensity THRR unchanged      Progression    Progression Continue to progress workloads to maintain intensity without signs/symptoms of physical distress.      Resistance Training   Training Prescription Yes    Weight blue bands    Reps 10-15    Time 10 Minutes      Oxygen   Oxygen Continuous    Liters 8      NuStep   Level 2    SPM 80    Minutes 30    METs 2.2           Social History   Tobacco Use  Smoking Status Former Smoker   Types: Cigarettes   Quit date: 12/17/1988   Years since quitting: 31.5  Smokeless Tobacco Never Used  Tobacco Comment   quit 1980    Goals Met:  Exercise tolerated well No report of cardiac concerns or symptoms Strength training completed today  Goals Unmet:  Not Applicable  Comments: Service time is from 1002 to 1103    Dr. Fransico Him is Medical Director for Cardiac Rehab at [facility].

## 2020-06-15 NOTE — Telephone Encounter (Signed)
ATC Lisa with pulmonary rehab.  Left VM to return call.  Patient is to use 5L of oxygen with exertion and 3L at night.

## 2020-06-15 NOTE — Telephone Encounter (Signed)
Regarding o2 u se - > patient is to use 8L with exertion. For rest- he can titrate to whatever will keep his pulse ox > 88% . He can do it himself with a finger pulse ox. These things chagne with time so best he titrate himself. Is possible that he needs 6L at rest as rehab says but possible he can get away with 3L. Even with exertion, a higher level exertion like rehab will need 8L but at home maybe he gets away as [place] said with 5L.   Regarding ONO - is there a qualifying regulatory need to get ONO? Otherwise, no need to do

## 2020-06-16 ENCOUNTER — Telehealth (HOSPITAL_COMMUNITY): Payer: Self-pay | Admitting: *Deleted

## 2020-06-16 NOTE — Telephone Encounter (Signed)
Called Dr. Golden Pop office back to discuss pt's order for oxygen at [facility] his oxygen provider. The last order is for 5 liters pulsed. I shared with them that at Pulmonary Rehab during his 6 minute walk test we used 6 liters of continuous oxygen 04/14/20. As we have exercised him here on the nu step he started out on 6 liters but we have now had to increase it to 8 liters for this seated exercise to keep his saturations greater than 88%. I have been attempting to get [employer] to get him more than the short refillable tanks for portable oxygen without success. These short tanks at 6-8 liters only last a short time and they take 3 hours to fill.He needs large tanks so that he can leave his home with no fear of not having enough oxygen. Magda Paganini is going to speak with Dr. Chase Caller about his oxygen prescription.

## 2020-06-16 NOTE — Telephone Encounter (Signed)
Spoke with Lattie Haw  I advised that we had sent order for 5 lpm pulsed o2  She states that this will not be enough for him  He is requiring 6-8  lpm continuous o2  Pt does have a 10 lpm concentrator at home Needs new order sent to [facility] and Lattie Haw also mentioned maybe needing to order an oximyzer for him as well  MR- please advise thanks

## 2020-06-16 NOTE — Telephone Encounter (Signed)
Attempted to call Lattie Haw with pulmonary rehab but unable to reach. Spoke with Thayer Headings who is going to give message to [place] for her to return our call.

## 2020-06-17 ENCOUNTER — Other Ambulatory Visit: Payer: Self-pay

## 2020-06-17 ENCOUNTER — Encounter (HOSPITAL_COMMUNITY)
Admission: RE | Admit: 2020-06-17 | Discharge: 2020-06-17 | Disposition: A | Discharge status: Home / Self Care | Payer: Medicare PPO | Source: Ambulatory Visit | Attending: Internal Medicine | Admitting: Internal Medicine

## 2020-06-17 DIAGNOSIS — J849 Interstitial pulmonary disease, unspecified: Secondary | ICD-10-CM

## 2020-06-17 DIAGNOSIS — F3341 Major depressive disorder, recurrent, in partial remission: Secondary | ICD-10-CM | POA: Diagnosis not present

## 2020-06-21 ENCOUNTER — Encounter (HOSPITAL_COMMUNITY): Payer: Self-pay | Admitting: *Deleted

## 2020-06-21 ENCOUNTER — Other Ambulatory Visit: Payer: Self-pay

## 2020-06-21 DIAGNOSIS — J849 Interstitial pulmonary disease, unspecified: Secondary | ICD-10-CM

## 2020-06-21 NOTE — Progress Notes (Signed)
Order was placed for patient to O2 changed and oximyzer . Nothing else further needed.

## 2020-06-21 NOTE — Telephone Encounter (Signed)
Yes this is fine.

## 2020-06-21 NOTE — Progress Notes (Signed)
Pt completed pulmonary rehab.  Post assessments completed.  Pt with very significant increase in his post PHQ9 -19 from 2 on 7/21/1.  Pt readily admits to struggling on a daily basis with depressed mood.  Pt looks forward to coming to pulmonary rehab and now that he is graduating he feels he has nothing in its place.  Pt is confined to home because of the inability to have a larger tank from his DME provider.  Pt is now on a much  high liter flow and the tanks that he has now do not support 6 liters for any length of time therefore this limits his ability to go out and about in the community.  Pt has reconnected with his phycologist that he has seen in the past and had an appt on 924.  Pt feels that the counseling helps but would also like to be placed on medication along with the therapy. Called pt primary MD - Dr. Philip Aspen.  Message left for [facility] his assistant.  Await return call. Cherre Huger, BSN Cardiac and Training and development officer

## 2020-06-22 DIAGNOSIS — R0902 Hypoxemia: Secondary | ICD-10-CM | POA: Diagnosis not present

## 2020-06-22 DIAGNOSIS — F3341 Major depressive disorder, recurrent, in partial remission: Secondary | ICD-10-CM | POA: Diagnosis not present

## 2020-06-22 DIAGNOSIS — R0600 Dyspnea, unspecified: Secondary | ICD-10-CM | POA: Diagnosis not present

## 2020-06-22 DIAGNOSIS — I5032 Chronic diastolic (congestive) heart failure: Secondary | ICD-10-CM | POA: Diagnosis not present

## 2020-06-22 DIAGNOSIS — R9389 Abnormal findings on diagnostic imaging of other specified body structures: Secondary | ICD-10-CM | POA: Diagnosis not present

## 2020-06-22 DIAGNOSIS — Z77098 Contact with and (suspected) exposure to other hazardous, chiefly nonmedicinal, chemicals: Secondary | ICD-10-CM | POA: Diagnosis not present

## 2020-06-23 NOTE — Telephone Encounter (Signed)
Order had been placed by [facility] 9/27. Closing encounter.

## 2020-06-24 MED FILL — ESBRIET 267 MG CAPSULE: 267 | 30 days supply | Qty: 270 | Fill #0

## 2020-07-06 ENCOUNTER — Ambulatory Visit: Payer: TRICARE For Life (TFL)

## 2020-07-06 ENCOUNTER — Other Ambulatory Visit: Payer: Self-pay

## 2020-07-06 DIAGNOSIS — I6522 Occlusion and stenosis of left carotid artery: Secondary | ICD-10-CM

## 2020-07-08 DIAGNOSIS — F3341 Major depressive disorder, recurrent, in partial remission: Secondary | ICD-10-CM | POA: Diagnosis not present

## 2020-07-09 ENCOUNTER — Other Ambulatory Visit: Payer: TRICARE For Life (TFL)

## 2020-07-09 DIAGNOSIS — G4733 Obstructive sleep apnea (adult) (pediatric): Secondary | ICD-10-CM | POA: Diagnosis not present

## 2020-07-09 NOTE — Telephone Encounter (Signed)
Sure. I can do this through [employer]

## 2020-07-11 ENCOUNTER — Other Ambulatory Visit: Payer: Self-pay | Admitting: Cardiology

## 2020-07-11 DIAGNOSIS — I6522 Occlusion and stenosis of left carotid artery: Secondary | ICD-10-CM

## 2020-07-12 ENCOUNTER — Other Ambulatory Visit: Payer: Self-pay | Admitting: *Deleted

## 2020-07-12 MED ORDER — ROPINIROLE HCL ER 12 MG PO TB24: ORAL_TABLET | ORAL | 0 refills | Status: AC

## 2020-07-15 DIAGNOSIS — Z794 Long term (current) use of insulin: Secondary | ICD-10-CM | POA: Diagnosis not present

## 2020-07-15 DIAGNOSIS — I130 Hypertensive heart and chronic kidney disease with heart failure and stage 1 through stage 4 chronic kidney disease, or unspecified chronic kidney disease: Secondary | ICD-10-CM | POA: Diagnosis not present

## 2020-07-15 DIAGNOSIS — Z23 Encounter for immunization: Secondary | ICD-10-CM | POA: Diagnosis not present

## 2020-07-15 DIAGNOSIS — E1140 Type 2 diabetes mellitus with diabetic neuropathy, unspecified: Secondary | ICD-10-CM | POA: Diagnosis not present

## 2020-07-19 ENCOUNTER — Telehealth: Payer: Medicare PPO | Admitting: Cardiology

## 2020-07-19 ENCOUNTER — Encounter: Payer: Self-pay | Admitting: Cardiology

## 2020-07-19 ENCOUNTER — Other Ambulatory Visit: Payer: Self-pay | Admitting: Pharmacist

## 2020-07-19 ENCOUNTER — Other Ambulatory Visit: Payer: Self-pay

## 2020-07-19 VITALS — BP 120/60 | HR 78 | Resp 16 | Ht 68.0 in | Wt 205.0 lb

## 2020-07-19 DIAGNOSIS — J849 Interstitial pulmonary disease, unspecified: Secondary | ICD-10-CM | POA: Diagnosis not present

## 2020-07-19 DIAGNOSIS — R0609 Other forms of dyspnea: Secondary | ICD-10-CM | POA: Diagnosis not present

## 2020-07-19 DIAGNOSIS — E7800 Pure hypercholesterolemia, unspecified: Secondary | ICD-10-CM

## 2020-07-19 DIAGNOSIS — R0600 Dyspnea, unspecified: Secondary | ICD-10-CM

## 2020-07-19 DIAGNOSIS — I6522 Occlusion and stenosis of left carotid artery: Secondary | ICD-10-CM

## 2020-07-19 DIAGNOSIS — J84112 Idiopathic pulmonary fibrosis: Secondary | ICD-10-CM

## 2020-07-19 DIAGNOSIS — F3341 Major depressive disorder, recurrent, in partial remission: Secondary | ICD-10-CM | POA: Diagnosis not present

## 2020-07-19 NOTE — Progress Notes (Signed)
Mr. roma, bierlein are scheduled for a virtual visit with your provider today.    Just as we do with appointments in the office, we must obtain your consent to participate.  Your consent will be active for this visit and any virtual visit you may have with one of our providers in the next 365 days.    If you have a MyChart account, I can also send a copy of this consent to you electronically.  All virtual visits are billed to your insurance company just like a traditional visit in the office.  As this is a virtual visit, video technology does not allow for your provider to perform a traditional examination.  This may limit your provider's ability to fully assess your condition.  If your provider identifies any concerns that need to be evaluated in person or the need to arrange testing such as labs, EKG, etc, we will make arrangements to do so.    Although advances in technology are sophisticated, we cannot ensure that it will always work on either your end or our end.  If the connection with a video visit is poor, we may have to switch to a telephone visit.  With either a video or telephone visit, we are not always able to ensure that we have a secure connection.   I need to obtain your verbal consent now.   Are you willing to proceed with your visit today?   ROHEN KIMES has provided verbal consent on 07/19/2020 for a virtual visit (video).   Adrian Prows, MD 07/19/2020  11:40 AM   Primary Physician/Referring:  Leanna Battles, MD  Patient ID: [name], male    DOB: Jan 13, 1946, 74 y.o.   MRN: [id number]  Chief Complaint  Patient presents with  . Congestive Heart Failure  . Hypertension  . Left Carotid Stenosis  . Follow-up    6 month   HPI:    SYLUS STGERMAIN  is a 74 y.o. Caucasian male with tobacco use disorder, hypertension, diabetes mellitus, hyperlipidemia, obstructive sleep apnea on CPAP and restless leg syndrome, chronic leg edema probably due to venous  insufficiency, interstitial lung disease and chronic hypoxemic respiratory failure presently on continuous 3 L oxygen, asymptomatic left carotid artery stenosis presents here for follow-up and evaluation of dyspnea and chronic diastolic heart failure.   I had seen him 6 months ago, and with a suspicion for ILD, I referred him to see Dr. Chase Caller.  He is now diagnosed with ILD.  He is presently on continuous home oxygen at 3 L.  Patient denies any chest pain, shortness of breath is remained stable, states that his leg edema is also stable.  He is pleased that he is now in pulmonary rehab and he appreciates having sent him to see the pulmonary specialty.  Denies any TIA-like symptoms, denies any visual disturbances, no chest pain, no PND or orthopnea.  Past Medical History:  Diagnosis Date  . Abnormality of gait   . Anxiety   . Diabetes ([place])   . Essential and other specified forms of tremor   . High blood pressure   . Other speech disturbance(784.59)   . Parkinson's disease [facility])    Past Surgical History:  Procedure Laterality Date  . cataract surgery Right   . HERNIA REPAIR    . NECK SURGERY     x4  . ROTATOR CUFF REPAIR Left    Social History   Tobacco Use  . Smoking status: Former Smoker  Types: Cigarettes    Quit date: 12/17/1988    Years since quitting: 31.6  . Smokeless tobacco: Never Used  . Tobacco comment: quit 1980  Substance Use Topics  . Alcohol use: Not Currently    Comment: quit in 1980   Marital Status: Married  ROS  Review of Systems  Cardiovascular: Positive for dyspnea on exertion and leg swelling. Negative for chest pain.  Musculoskeletal: Positive for back pain and joint pain.   Objective  Blood pressure 120/60, pulse 78, resp. rate 16, height 5' 8" (1.727 m), weight 205 lb (93 kg).  Vitals with BMI 07/19/2020 06/15/2020 06/01/2020  Height 5' 8" - -  Weight 205 lbs 202 lbs 10 oz 200 lbs 10 oz  BMI 92.42 - 68.34  Systolic 196 - -  Diastolic 60 - -    Pulse 78 - -    Physical exam not performed or limited due to virtual visit.  Patient appeared to be in no distress, Neck was supple, respiration was not labored.  Please see exam details from prior visit is as below.    Physical Exam Constitutional:      Appearance: He is well-developed.     Comments: Mildly obese in no acute distress  Cardiovascular:     Rate and Rhythm: Normal rate and regular rhythm.     Pulses: Normal pulses and intact distal pulses.          Carotid pulses are on the left side with bruit.    Heart sounds: Normal heart sounds. No murmur heard.  No gallop.      Comments: 1-2+ bilateral leg edema, pitting.  Chronic venous stasis changes noted. Old healed venous ulcerations with scar noted. No JVD. Except for right PT which is faint, lower extremity pulses are normal.  Left carotid bruit present.  Pulmonary:     Effort: Pulmonary effort is normal.     Breath sounds: Rales (bibasilar crackles, fine) present.  Abdominal:     General: Bowel sounds are normal.     Palpations: Abdomen is soft.    Laboratory examination:   Recent Labs    12/31/19 0826 01/07/20 0807  NA 136 137  K 5.4* 4.2  CL 96 99  CO2 26 25  GLUCOSE 158* 228*  BUN 22 19  CREATININE 1.33* 1.15  CALCIUM 10.5* 9.8  GFRNONAA 53* 64  GFRAA 62 74   CrCl cannot be calculated (Patient's most recent lab result is older than the maximum 21 days allowed.).  CMP Latest Ref Rng & Units 05/11/2020 01/07/2020 12/31/2019  Glucose 65 - 99 mg/dL - 228(H) 158(H)  BUN 8 - 27 mg/dL - 19 22  Creatinine 0.76 - 1.27 mg/dL - 1.15 1.33(H)  Sodium 134 - 144 mmol/L - 137 136  Potassium 3.5 - 5.2 mmol/L - 4.2 5.4(H)  Chloride 96 - 106 mmol/L - 99 96  CO2 20 - 29 mmol/L - 25 26  Calcium 8.6 - 10.2 mg/dL - 9.8 10.5(H)  Total Protein 6.0 - 8.3 g/dL 6.8 - -  Total Bilirubin 0.2 - 1.2 mg/dL 0.7 - -  Alkaline Phos 39 - 117 U/L 62 - -  AST 0 - 37 U/L 25 - -  ALT 0 - 53 U/L 10 - -   CBC 08/24/2008  Hemoglobin  13.8   External labs:   Cholesterol, total 109.000 m 05/13/2019 HDL 25 MG/DL 05/13/2019 LDL 68.000 mg 05/13/2019 Triglycerides 81.000 05/13/2019  A1C 6.700 % 01/08/2020  Hemoglobin 11.900 g/d 12/11/2019  Creatinine,  Serum 1.150 mg/ 01/07/2020 Potassium 4.200 mm 01/07/2020 ALT (SGPT) 10.000 U/L 05/11/2020  BNP 14.400 pg/ 12/31/2019  12/15/2019: COVID-19 rapid test negative Hb 11.9/HCT 41.7, WBC 13.0, platelets 317. BNP 288.  05/13/2019:  Cholesterol, total 109.000 HDL 25 MG/DL, LDL 68.000 mg, Triglycerides 81.000, A1C 7.500 %,  Serum Creatinine 0.900 mg  Medications and allergies   Allergies  Allergen Reactions  . Gadolinium Derivatives Other (See Comments)    Patient sneezed immediately after administering gadolinium/ no treatment/ pt had no other reaction//jv     Current Outpatient Medications  Medication Instructions  . ADVAIR DISKUS 250-50 MCG/DOSE AEPB 1 puff, Inhalation, 2 times daily  . albuterol (VENTOLIN HFA) 108 (90 Base) MCG/ACT inhaler INHALE 2 PUFFS EVERY 4-6 HOURS AS NEEDED FOR COUGH/SHORTNESS OF BREATH  . Armodafinil 150 mg, Oral,  Every morning - 10a  . aspirin EC 81 mg, Oral, Daily  . atorvastatin (LIPITOR) 20 mg, Daily  . augmented betamethasone dipropionate (DIPROLENE-AF) 0.05 % ointment Topical, 2 times daily  . carbidopa-levodopa (SINEMET IR) 25-100 MG tablet TAKE 1 TABLET BY MOUTH 5 TIMES PER DAY  . DULoxetine (CYMBALTA) 60 mg, Daily  . empagliflozin (JARDIANCE) 25 mg, Oral, Daily  . furosemide (LASIX) 20 MG tablet TAKE 1 TABLET BY MOUTH DAILY  . gabapentin (NEURONTIN) 300 mg, Oral, 2 times daily  . HYDROcodone-acetaminophen (NORCO) 10-325 MG per tablet 10-325 tablets, Oral, 3 times daily PRN  . insulin lispro (HUMALOG) 8 Units, Subcutaneous, 3 times daily  . metFORMIN (GLUCOPHAGE) 1,000 mg, Oral, 2 times daily  . Multiple Vitamin (MULTIVITAMIN WITH MINERALS) TABS tablet 1 tablet, Oral, Daily  . NexIUM 40 mg, Daily  . Pirfenidone (ESBRIET) 267 MG CAPS 3  capsules, Oral, 3 times daily with meals  . propranolol (INDERAL) 40 mg, Oral, 2 times daily  . Ropinirole HCl 12 MG TB24 TAKE ONE (1) TABLET BY MOUTH EACH DAY. Please call [phone] to schedule a follow up appt.  . telmisartan-hydrochlorothiazide (MICARDIS HCT) 80-25 MG per tablet 1 tablet, Oral, Daily  . Tiotropium Bromide Monohydrate (SPIRIVA RESPIMAT) 2.5 MCG/ACT AERS 2 puffs, Inhalation, Daily  . Toujeo SoloStar 30 Units, Subcutaneous, Daily    Radiology:   Chest x-ray PA and lateral view 12/11/2019: Diffuse bilateral lung opacities of uncertain etiology, may represent acute airspace disease, chronic parenchymal lung changes are a combination of each.  Compared to 2017, the chest previously was hyperexpanded but clear suggestive of emphysema.  Chest x-ray PA and lateral view 12/22/2019: 1. Low lung volumes with areas of interstitial prominence throughout the mid to lower lungs bilaterally. This may simply reflect developing interstitial lung disease, however, the possibility of acute infection such as viral pneumonia (i.e., COVID-19 infection) should be considered with the appropriate clinical presentation. These findings could be better evaluated with follow-up high-resolution chest CT if clinically appropriate.  Cardiac Studies:   Echocardiogram 12/24/2019:  Left ventricle cavity is normal in size. Mild concentric hypertrophy of  the left ventricle. Normal global wall motion. Normal LV systolic function  with visual EF 55-60%. Doppler evidence of grade I (impaired) diastolic  dysfunction, normal LAP. Left atrial cavity is mildly dilated.  Mild (Grade I) mitral regurgitation.  Inadequate TR jet to estimate pulmonary artery systolic pressure. Normal right atrial pressure.   Lexiscan Tetrofosmin stress test 12/29/2019: No previous exam available for comparison. Lexiscan nuclear stress test performed using 1-day protocol. Stress EKG is non-diagnostic, as this is pharmacological stress  test. In addition, stress EKG showed sinus tachycardia, no ST-T changes.  Decreased tracer  uptake in basal inferior myocardium, seen both in rest and stress images, likely due to diaphragmatic attenuation. Stress LVEF 73%. Low risk study.    Carotid artery duplex 07/06/2020: Minimal stenosis in the right internal carotid artery (1-15%). Stenosis in the right external carotid artery (<50%). Stenosis in the left internal carotid artery (50-69%). Stenosis in the common carotid artery of >50% with heterogeneous plaque. Stenosis in the left external carotid artery (<50%). Right vertebral artery flow is not visualized. Left vertebral artery flow is not visualized. Follow up in six months is appropriate if clinically indicated. Compared to 12/24/2019, there is progression of the disease in the left carotid bulb.   EKG:   EKG 12/18/2019: Normal sinus rhythm at rate of 81 bpm, borderline criteria for left enlargement.  Nonspecific abnormality.  PACs (2)    Assessment     ICD-10-CM   1. Dyspnea on exertion  R06.00   2. Interstitial lung disease ([place])  J84.9   3. Asymptomatic stenosis of left carotid artery  I65.22   4. Hypercholesteremia  E78.00     No orders of the defined types were placed in this encounter.   There are no discontinued medications.   Recommendations:   KAYLIN SCHELLENBERG  is a 74 y.o. Caucasian male with tobacco use disorder, hypertension, diabetes mellitus, hyperlipidemia, obstructive sleep apnea on CPAP and restless leg syndrome, chronic leg edema probably due to venous insufficiency, interstitial lung disease and chronic hypoxemic respiratory failure presently on continuous 3 L oxygen, asymptomatic left carotid artery stenosis presents here for follow-up and evaluation of dyspnea and chronic diastolic heart failure.   6 months ago I diagnosed him with ILD and referred him to see Dr. Chase Caller.  He is now on continuous home oxygen at 3 L/min.  Dyspnea has remained stable, no  PND or orthopnea or leg edema is also stable.  I reviewed the results of the carotid artery duplex, no significant change in carotid stenosis.  I reviewed his external labs, lipids are under excellent control.  His diabetes is also well controlled.  Continue present medications, blood pressure is also well controlled, I will see him back in the office in 6 months.  No changes in the medications were done today.   Adrian Prows, MD, [facility] 07/19/2020, 11:40 AM Office: [phone] Pager: [phone]

## 2020-07-19 NOTE — Telephone Encounter (Signed)
Routing refill request to clinical staff

## 2020-07-20 ENCOUNTER — Telehealth: Payer: Self-pay | Admitting: Internal Medicine

## 2020-07-20 ENCOUNTER — Ambulatory Visit (INDEPENDENT_AMBULATORY_CARE_PROVIDER_SITE_OTHER): Payer: Medicare PPO | Admitting: Internal Medicine

## 2020-07-20 ENCOUNTER — Other Ambulatory Visit: Payer: Self-pay

## 2020-07-20 DIAGNOSIS — R5383 Other fatigue: Secondary | ICD-10-CM

## 2020-07-20 DIAGNOSIS — R634 Abnormal weight loss: Secondary | ICD-10-CM

## 2020-07-20 DIAGNOSIS — I288 Other diseases of pulmonary vessels: Secondary | ICD-10-CM | POA: Diagnosis not present

## 2020-07-20 DIAGNOSIS — J84112 Idiopathic pulmonary fibrosis: Secondary | ICD-10-CM | POA: Diagnosis not present

## 2020-07-20 DIAGNOSIS — J9611 Chronic respiratory failure with hypoxia: Secondary | ICD-10-CM

## 2020-07-20 DIAGNOSIS — Z79899 Other long term (current) drug therapy: Secondary | ICD-10-CM | POA: Diagnosis not present

## 2020-07-20 DIAGNOSIS — R911 Solitary pulmonary nodule: Secondary | ICD-10-CM | POA: Diagnosis not present

## 2020-07-20 DIAGNOSIS — F3341 Major depressive disorder, recurrent, in partial remission: Secondary | ICD-10-CM | POA: Diagnosis not present

## 2020-07-20 DIAGNOSIS — T50905A Adverse effect of unspecified drugs, medicaments and biological substances, initial encounter: Secondary | ICD-10-CM

## 2020-07-20 DIAGNOSIS — J439 Emphysema, unspecified: Secondary | ICD-10-CM

## 2020-07-20 NOTE — Progress Notes (Signed)
Discharge Progress Report  Patient Details  Name: JAKALEB PAYER MRN: [id number] Date of Birth: 1946/07/14 Referring Provider:     Pulmonary Rehab Walk Test from 04/14/2020 in [facility]  Referring Provider Marty, [place]       Number of Visits: 1  Reason for Discharge:  Patient reached a stable level of exercise. Patient independent in their exercise. Patient has met program and personal goals.    Smoking History:  Social History   Tobacco Use  Smoking Status Former Smoker   Types: Cigarettes   Quit date: 12/17/1988   Years since quitting: 31.6  Smokeless Tobacco Never Used  Tobacco Comment   quit 1980    Diagnosis:  Interstitial lung disease ([place])  ADL [facility]:  Pulmonary Assessment Scores    Row Name 04/14/20 1012 04/20/20 1517 06/17/20 1610     ADL [facility]   ADL Phase Entry Entry Exit   SOB Score total -- 58 35     CAT Score   CAT Score -- 19 17  exit     mMRC Score   mMRC Score 2 -- --          Initial Exercise Prescription:  Initial Exercise Prescription - 04/14/20 1000      Date of Initial Exercise RX and Referring Provider   Date 04/14/20    Referring Provider Brand Males    Expected Discharge Date 06/17/20      Oxygen   Oxygen Continuous    Liters 4      NuStep   Level 1    SPM 65    Minutes 30    METs 1.6      Prescription Details   Frequency (times per week) 2    Duration Progress to 30 minutes of continuous aerobic without signs/symptoms of physical distress      Intensity   THRR 40-80% of Max Heartrate 60-119    Ratings of Perceived Exertion 11-13    Perceived Dyspnea 0-4      Progression   Progression Continue progressive overload as per policy without signs/symptoms or physical distress.      Resistance Training   Training Prescription Yes    Weight Theraband    Reps 10-15           Discharge Exercise Prescription (Final Exercise Prescription Changes):  Exercise  Prescription Changes - 06/15/20 1400      Response to Exercise   Blood Pressure (Admit) 110/60    Blood Pressure (Exercise) 110/56    Blood Pressure (Exit) 120/60    Heart Rate (Admit) 68 bpm    Heart Rate (Exercise) 77 bpm    Heart Rate (Exit) 72 bpm    Oxygen Saturation (Admit) 96 %    Oxygen Saturation (Exercise) 91 %    Oxygen Saturation (Exit) 92 %    Rating of Perceived Exertion (Exercise) 11    Perceived Dyspnea (Exercise) 1    Duration Continue with 30 min of aerobic exercise without signs/symptoms of physical distress.    Intensity THRR unchanged      Progression   Progression Continue to progress workloads to maintain intensity without signs/symptoms of physical distress.      Resistance Training   Training Prescription Yes    Weight blue bands    Reps 10-15    Time 10 Minutes      Oxygen   Oxygen Continuous    Liters 8      NuStep   Level 2  SPM 80    Minutes 30    METs 2.2           Functional Capacity:  6 Minute Walk    Row Name 04/14/20 1012 06/17/20 1239       6 Minute Walk   Phase Initial Discharge    Distance 592 feet 420 feet    Distance % Change -- -29 %    Distance Feet Change -- -172 ft    Walk Time 6 minutes 6 minutes    # of Rest Breaks 1  took break to readjust SpO2 monitor on fingers. Aprrox. 30 seconds 2    MPH 1.12 0.79    METS 1.17 1.02    RPE 13 11    Perceived Dyspnea  1 1    VO2 Peak 4.09 3.58    Symptoms Yes (comment) Yes (comment)  oxygen saturation dropped below 80%    Comments SOB PPD = 1. No other complaints. --    Resting HR 69 bpm 62 bpm    Resting BP 112/64 104/50    Resting Oxygen Saturation  95 % 96 %    Exercise Oxygen Saturation  during 6 min walk 84 % 80 %    Max Ex. HR 79 bpm 96 bpm    Max Ex. BP 116/70 120/56    2 Minute Post BP 110/70 110/56      Interval HR   1 Minute HR 77 79    2 Minute HR 79 80    3 Minute HR 77 80    4 Minute HR 76 86    5 Minute HR 77 95    6 Minute HR 86 96    2 Minute  Post HR 67 72    Interval Heart Rate? Yes Yes      Interval Oxygen   Interval Oxygen? Yes Yes    Baseline Oxygen Saturation % 95 % 96 %    1 Minute Oxygen Saturation % 89 % 89 %    1 Minute Liters of Oxygen 3 L 8 L    2 Minute Oxygen Saturation % 85 %  Increased to 4L/min at min 2 88 %    2 Minute Liters of Oxygen 4 L 8 L    3 Minute Oxygen Saturation % 84 % 87 %    3 Minute Liters of Oxygen 4 L 8 L    4 Minute Oxygen Saturation % 87 % 95 %    4 Minute Liters of Oxygen 6 L 8 L    5 Minute Oxygen Saturation % 92 % 80 %    5 Minute Liters of Oxygen 6 L 8 L    6 Minute Oxygen Saturation % 90 % 94 %    6 Minute Liters of Oxygen 6 L 8 L    2 Minute Post Oxygen Saturation % 95 % 100 %    2 Minute Post Liters of Oxygen 3 L 8 L           Psychological, QOL, Others - Outcomes: PHQ 2/9: Depression screen [facility] 2/9 06/17/2020 04/14/2020  Decreased Interest 3 0  Down, Depressed, Hopeless 3 0  PHQ - 2 Score 6 0  Altered sleeping 3 0  Tired, decreased energy 3 0  Change in appetite 2 0  Feeling bad or failure about yourself  3 0  Trouble concentrating 2 1  Moving slowly or fidgety/restless 0 0  Suicidal thoughts 0 0  PHQ-9 Score 19 1  Difficult  doing work/chores Very difficult Not difficult at all    Quality of Life:   Personal Goals: Goals established at orientation with interventions provided to work toward goal.  Personal Goals and Risk Factors at Admission - 04/14/20 0929      Core Components/Risk Factors/Patient Goals on Admission   Improve shortness of breath with ADL's Yes    Intervention Provide education, individualized exercise plan and daily activity instruction to help decrease symptoms of SOB with activities of daily living.    Expected Outcomes Short Term: Improve cardiorespiratory fitness to achieve a reduction of symptoms when performing ADLs;Long Term: Be able to perform more ADLs without symptoms or delay the onset of symptoms            Personal Goals  Discharge:  Goals and Risk Factor Review    Row Name 04/14/20 0929 05/05/20 1042 06/02/20 1351 06/02/20 1627 07/20/20 2239     Core Components/Risk Factors/Patient Goals Review   Personal Goals Review Increase knowledge of respiratory medications and ability to use respiratory devices properly.;Improve shortness of breath with ADL's;Develop more efficient breathing techniques such as purse lipped breathing and diaphragmatic breathing and practicing self-pacing with activity. Increase knowledge of respiratory medications and ability to use respiratory devices properly.;Improve shortness of breath with ADL's;Develop more efficient breathing techniques such as purse lipped breathing and diaphragmatic breathing and practicing self-pacing with activity.;Diabetes Increase knowledge of respiratory medications and ability to use respiratory devices properly.;Improve shortness of breath with ADL's;Develop more efficient breathing techniques such as purse lipped breathing and diaphragmatic breathing and practicing self-pacing with activity.;Diabetes -- --   Review -- Pt has completed 5 exercsie sessions. Pt reports that he is able shop in large stores with the assistance of shopping cart with less shortness of breath. Pt diabetes is well mangaged with current medicine regimen, diet and effort to increase his activity. Pt has completed 11 exercsie sessions. Demonstrates appropriate breathing techniques such as PLB with verbal cues. Would like to move pt to employ these techniques independently.  Pt diabetes is well mangaged with current medicine regimen, diet and effort to increase his activity. Pulmonary staff contacting [place] for getting larger tanks for him to use so he can go out into the community on his needed 6L without running out of oxygen. [name]  has completed 11 exercsie sessions. Demonstrates appropriate breathing techniques such as PLB with verbal cues. Would like to move pt to employ these  techniques independently.  Pt diabetes is well mangaged with current medicine regimen, diet and effort to increase his activity. Pulmonary staff contacting [place] for getting larger tanks for him to use so he can go out into the community on his needed 6L without running out of oxygen. Bill graduates with the completion of 15 exercise sessions. [name] is a delightful patient and staff enjoyed working with him.  Bill had 142 feet increase on his post walk test. Bill completed knowledge test which showed the same score 16/18 (misssed different question. Bill had a significant decrease in his dyspnea scale with 58 to 35. [name] also had a decrease in his COPD assessmetn test from 19-17.   Expected Outcomes -- Pt will demonstarte efficient breathing techniques such as PLB and diaphragmatic breahing will pacing himself during activity.  Pt will continue to maintain appropriate managment of his diabetes by compliance with medication, adherence to nutritional reccomendation and identify opportunites to increase his activity Pt will demonstarte efficient breathing techniques such as PLB and diaphragmatic breahing will pacing himself during  activity.  Pt will continue to maintain appropriate managment of his diabetes by compliance with medication, adherence to nutritional reccomendation and identify opportunites to increase his activity See Admission Goals Bill met his admisssion goals.          Exercise Goals and Review:  Exercise Goals    Row Name 04/14/20 1021 05/04/20 1142 06/03/20 1457 06/03/20 1506       Exercise Goals   Increase Physical Activity Yes Yes Yes Yes    Intervention Provide advice, education, support and counseling about physical activity/exercise needs.;Develop an individualized exercise prescription for aerobic and resistive training based on initial evaluation findings, risk stratification, comorbidities and participant's personal goals. Provide advice, education, support and  counseling about physical activity/exercise needs.;Develop an individualized exercise prescription for aerobic and resistive training based on initial evaluation findings, risk stratification, comorbidities and participant's personal goals. Provide advice, education, support and counseling about physical activity/exercise needs.;Develop an individualized exercise prescription for aerobic and resistive training based on initial evaluation findings, risk stratification, comorbidities and participant's personal goals. --    Expected Outcomes Short Term: Attend rehab on a regular basis to increase amount of physical activity.;Long Term: Add in home exercise to make exercise part of routine and to increase amount of physical activity.;Long Term: Exercising regularly at least 3-5 days a week. Short Term: Attend rehab on a regular basis to increase amount of physical activity.;Long Term: Add in home exercise to make exercise part of routine and to increase amount of physical activity.;Long Term: Exercising regularly at least 3-5 days a week. Short Term: Attend rehab on a regular basis to increase amount of physical activity.;Long Term: Add in home exercise to make exercise part of routine and to increase amount of physical activity.;Long Term: Exercising regularly at least 3-5 days a week. --    Increase Strength and Stamina Yes Yes Yes --    Intervention Provide advice, education, support and counseling about physical activity/exercise needs.;Develop an individualized exercise prescription for aerobic and resistive training based on initial evaluation findings, risk stratification, comorbidities and participant's personal goals. Provide advice, education, support and counseling about physical activity/exercise needs.;Develop an individualized exercise prescription for aerobic and resistive training based on initial evaluation findings, risk stratification, comorbidities and participant's personal goals. Provide advice,  education, support and counseling about physical activity/exercise needs.;Develop an individualized exercise prescription for aerobic and resistive training based on initial evaluation findings, risk stratification, comorbidities and participant's personal goals. --    Expected Outcomes Short Term: Increase workloads from initial exercise prescription for resistance, speed, and METs.;Short Term: Perform resistance training exercises routinely during rehab and add in resistance training at home;Long Term: Improve cardiorespiratory fitness, muscular endurance and strength as measured by increased METs and functional capacity (6MWT) Short Term: Increase workloads from initial exercise prescription for resistance, speed, and METs.;Short Term: Perform resistance training exercises routinely during rehab and add in resistance training at home;Long Term: Improve cardiorespiratory fitness, muscular endurance and strength as measured by increased METs and functional capacity (6MWT) Short Term: Increase workloads from initial exercise prescription for resistance, speed, and METs.;Short Term: Perform resistance training exercises routinely during rehab and add in resistance training at home;Long Term: Improve cardiorespiratory fitness, muscular endurance and strength as measured by increased METs and functional capacity (6MWT) --    Able to understand and use rate of perceived exertion (RPE) scale Yes Yes Yes --    Intervention Provide education and explanation on how to use RPE scale Provide education and explanation on how to use  RPE scale Provide education and explanation on how to use RPE scale --    Expected Outcomes Short Term: Able to use RPE daily in rehab to express subjective intensity level;Long Term:  Able to use RPE to guide intensity level when exercising independently Short Term: Able to use RPE daily in rehab to express subjective intensity level;Long Term:  Able to use RPE to guide intensity level when  exercising independently Short Term: Able to use RPE daily in rehab to express subjective intensity level;Long Term:  Able to use RPE to guide intensity level when exercising independently --    Able to understand and use Dyspnea scale Yes Yes Yes --    Intervention Provide education and explanation on how to use Dyspnea scale Provide education and explanation on how to use Dyspnea scale Provide education and explanation on how to use Dyspnea scale --    Expected Outcomes Short Term: Able to use Dyspnea scale daily in rehab to express subjective sense of shortness of breath during exertion;Long Term: Able to use Dyspnea scale to guide intensity level when exercising independently Short Term: Able to use Dyspnea scale daily in rehab to express subjective sense of shortness of breath during exertion;Long Term: Able to use Dyspnea scale to guide intensity level when exercising independently Short Term: Able to use Dyspnea scale daily in rehab to express subjective sense of shortness of breath during exertion;Long Term: Able to use Dyspnea scale to guide intensity level when exercising independently --    Knowledge and understanding of Target Heart Rate Range (THRR) Yes Yes Yes --    Intervention Provide education and explanation of THRR including how the numbers were predicted and where they are located for reference Provide education and explanation of THRR including how the numbers were predicted and where they are located for reference Provide education and explanation of THRR including how the numbers were predicted and where they are located for reference --    Expected Outcomes Short Term: Able to state/look up THRR;Long Term: Able to use THRR to govern intensity when exercising independently;Short Term: Able to use daily as guideline for intensity in rehab Short Term: Able to state/look up THRR;Long Term: Able to use THRR to govern intensity when exercising independently;Short Term: Able to use daily as  guideline for intensity in rehab Short Term: Able to state/look up THRR;Long Term: Able to use THRR to govern intensity when exercising independently;Short Term: Able to use daily as guideline for intensity in rehab --    Understanding of Exercise Prescription Yes Yes Yes --    Intervention Provide education, explanation, and written materials on patient's individual exercise prescription Provide education, explanation, and written materials on patient's individual exercise prescription Provide education, explanation, and written materials on patient's individual exercise prescription --    Expected Outcomes Short Term: Able to explain program exercise prescription;Long Term: Able to explain home exercise prescription to exercise independently Short Term: Able to explain program exercise prescription;Long Term: Able to explain home exercise prescription to exercise independently Short Term: Able to explain program exercise prescription;Long Term: Able to explain home exercise prescription to exercise independently --           Exercise Goals Re-Evaluation:  Exercise Goals Re-Evaluation    Row Name 05/04/20 1143 06/03/20 1506           Exercise Goal Re-Evaluation   Exercise Goals Review Increase Physical Activity;Increase Strength and Stamina;Able to understand and use rate of perceived exertion (RPE) scale;Able to understand and use  Dyspnea scale;Knowledge and understanding of Target Heart Rate Range (THRR);Understanding of Exercise Prescription Increase Physical Activity;Increase Strength and Stamina;Able to understand and use rate of perceived exertion (RPE) scale;Able to understand and use Dyspnea scale;Knowledge and understanding of Target Heart Rate Range (THRR);Understanding of Exercise Prescription      Comments Pt has attended 5 exercise sessions. He has a positive outlook and is eager for workload increases. He has gotten a new home oxygen concentrator that goes up to 10 L, so he can  exercise safely at home. His old one only went up to 5 L. He is also going to switch to e tanks instead of his POC, as his POC does not give him an adequate amount of liter flow. He is currently exercising at 2.3 METs on the stepper. Will continue to monitor and progress as able. Pt has completed 12 exercise sessions with pulmonary rehab. He continues to tolerate exercise well and has a positive outlook. He is exercising at 2.1 METs on the Nustep. We will continue to progress and monitor as able.      Expected Outcomes Through exercise at rehab and at home, the patient will decrease shortness of breath with daily activities and feel confident in carrying out an exercise regime at home. Through exercise at rehab and at home, the patient will decrease shortness of breath with daily activities and feel confident in carrying out an exercise regime at home.             Nutrition & Weight - Outcomes:  Pre Biometrics - 04/14/20 0922      Pre Biometrics   Triceps Skinfold 30 mm            Nutrition:  Nutrition Therapy & Goals - 05/04/20 1424      Nutrition Therapy   Diet Carb modified      Personal Nutrition Goals   Nutrition Goal Pt to build a healthy plate including vegetables, fruits, whole grains, and low-fat dairy products in a healthy meal plan.    Personal Goal #2 Pt to incorporate fiber rich foods daily to reach goal of minimum fiber intake 28 g/day      Intervention Plan   Intervention Nutrition handout(s) given to patient.;Prescribe, educate and counsel regarding individualized specific dietary modifications aiming towards targeted core components such as weight, hypertension, lipid management, diabetes, heart failure and other comorbidities.    Expected Outcomes Short Term Goal: A plan has been developed with personal nutrition goals set during dietitian appointment.;Long Term Goal: Adherence to prescribed nutrition plan.           Nutrition Discharge:  Nutrition Assessments -  07/02/20 1144      Rate Your Plate Scores   Post Score 65           Education Questionnaire Score:  Knowledge Questionnaire Score - 06/17/20 1611      Knowledge Questionnaire Score   Post Score 16/18           Goals reviewed with patient. Cherre Huger, BSN Cardiac and Training and development officer

## 2020-07-20 NOTE — Addendum Note (Signed)
Encounter addended by: Rowe Pavy, RN on: 07/20/2020 11:18 PM  Actions taken: Episode resolved, Flowsheet accepted

## 2020-07-20 NOTE — Progress Notes (Signed)
OV 02/19/2020  Subjective:  Patient ID: Steven Pitts, male , DOB: April 24, 1946 , age 74 y.o. , MRN: [id number] , ADDRESS: [address] [address]   02/19/2020 -   Chief Complaint  Patient presents with   Consult    Referred by Dr. Einar Gip for SOB for the past 3 months. Notices the SOB with exertion. Currently not on any oxygen therapy.      Steven Pitts 74 y.o. -has been referred by Dr. Einar Gip cardiologist for evaluation of dyspnea and concern for interstitial lung disease.  According to the patient he has had insidious onset of dyspnea and cough for the last 3 months.  It is progressive.  In fact today when he was roomed in his pulse ox was found to be 84% when he walked in and sat down.  This is a new onset dyspnea.  He has never had this problem before.  There is no wheezing there is no orthopnea there is no proximal nocturnal dyspnea.  He says his cardiac issues have been cleared by Dr. Einar Gip.  Review of the records indicate that patient has hypertension, diabetes, hyperlipidemia obstructive sleep apnea on CPAP and restless leg syndrome.  He did have a chest x-ray March  2021 which I visualized that was concerning for pneumonia according to the notes but to me looks like interstitial lung disease.  It appears he was initially started on Lasix by the primary care team and this helped an improvement but not resolution.  He had echo end of March 2021 that shows grade 1 diastolic dysfunction with ejection fraction 55-60%.  Dr. Einar Gip felt patient was euvolemic after diuresis but still has residual basal crackles [I also auscultate this] and therefore has been referred here to the ILD center.   [facility] Integrated Comprehensive ILD Questionnaire  Symptoms:    SYMPTOM SCALE - ILD 02/19/2020   O2 use ra  Shortness of Breath 0 -> 5 scale with 5 being worst (score 6 If unable to do)  At rest 0  Simple tasks - showers, clothes change, eating, shaving 0  Household (dishes,  doing bed, laundry) 3  Shopping 4  Walking level at own pace 2  Walking up Stairs 5  Total (30-36) Dyspnea Score 11  How bad is your cough? 2  How bad is your fatigue yes  How bad is nausea no  How bad is vomiting?  no  How bad is diarrhea? no  How bad is anxiety? x  How bad is depression x       Past Medical History : Positive for diastolic heart failure .  Positive for sleep apnea for the last several years.  Positive for hiatal hernia/acid reflux for the last few decades.  Positive for diabetes for the last several years..  Negative for asthma or COPD.  Negative for collagen vascular disease or vasculitis.  Negative for HIV.  Negative for thyroid disease.  Negative for stroke or seizures.  Negative for mononucleosis.  Negative for blood clots pleurisy.   ROS: Positive for fatigue for the last 3 months.  Positive for joint stiffness and swelling for the last several years.  Has dry eyes and mouth for the last several years.  Has noticed color change in his fingers this past winter.  Otherwise no weight loss or nausea.  He does snore and is being treated for sleep apnea.   FAMILY HISTORY of LUNG DISEASE: Negative for pulmonary fibrosis or COPD or asthma sarcoid  or cystic fibrosis.   EXPOSURE HISTORY: Smokes cigarettes between [place].  35 cigarettes/day.  No passive smoking.  He smoked pipes in the past.  No marijuana use currently but he did smoke quit in 1971 in 1980 very little.  No vaping or electronic cigarettes.  Did not use cocaine at any time.  Do not use IV drug use.   HOME and HOBBY DETAILS : [place].  He was in a damp living environment in [place].  He was exposed to agent orange.  Current home does not have a humidifier.  No mold or mildew no nebulizer machine.  No steam iron use.  No misting [place] inside the house no pet birds or parakeets.  No pet gerbils or hamsters or rabbits.  There is no mold in the [facility]  duct.  Does not do any gardening.  No birds in the house no flood of water damage.  No strong mats.  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  Did do pest control work and Psychologist, counselling in the past.  Inorganic antigen: Positive exposure to [employer] and woodworking Financial trader.  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  However chest x-ray done in 2007 looks clear to me but March 2021 chest x-ray that I personally visualized definitely shows ILD.   Simple office walk 185 feet x  3 laps goal with forehead probe 02/19/2020   O2 used ra  Number laps completed 1/4 of 1 laps  Comments about pace Slow pace with cane  Resting Pulse Ox/HR 88% and 63/min  Final Pulse Ox/HR 83% and 77/min  Desaturated </= 88% Even at rest 88%  Desaturated <= 3% points yes  Got Tachycardic >/= 90/min no  Symptoms at end of test dyspneic  Miscellaneous comments Corrected wuthg 3 L to complete 1 lap with cane     03/19/20- [name], NP 74 year old male former smoker (quit 1990) seen for pulmonary consult Feb 19, 2020 for progressive dyspnea since March 2021 and possible ILD  Patient returns for a 1 month follow-up.  Patient was seen last visit for pulmonary consult for shortness of breath since March 2021.  Patient complains over the last 3 months he has been getting progressively worse with shortness of breath with minimum activity, decreased activity tolerance.  Prior to this past year he was able to mow his grass he says he is not able to do that.  He is able to stand still without any significant shortness of breath.  He does do woodworking and feels that he is okay to do this.  He has  now been started on oxygen due to desaturations with activities.  He is currently on oxygen 2 L at rest and 3 L with activity.  Patient says he does have a dry cough.  He denies any hemoptysis chest pain orthopnea or PND.  Patient was set up for pulmonary function testing that was done  on February 24, 2020 showed moderate restriction with an FEV1 at 77%, ratio 78, FVC 72%, no significant bronchodilator response, severe diffusing defect with a DLCO at 37%.  We discussed increasing his physical reconditioning with pulmonary rehab which he is in agreement.    05/07/2020-Interim history Patient presents today to re-qualify for oxygen. During last visit he was referred to pulmonary rehab, ordered for serology labs and ONO. Autoimmune labs positive for ANA, sed rate 39. Rheumatoid factor negative. Pulmonary function testing on 02/24/20 showed moderate restriction with no BD response and severe diffusion defect at 37%. He reports moderate-severe dyspnea, fatigue and occasional cough. He has had neck, lower back and should pain for 10+ years. He has started pulmonary rehab, he is attending twice a week. He normally wears 3L oxygen at rest, 5L on exertion during the day and wears 3L at night. He has been needing to use 6-6.5L during pulmonary rehab. Today on walk he needed 5L to maintain O2 >90%. He is scheduled for follow-up with Dr. Chase Caller next week.    OV 05/11/2020  Subjective:  Patient ID: Steven Pitts, male , DOB: April 23, 1946 , age 74 y.o. , MRN: [id number] , ADDRESS: [address] [place] [facility] [address]   05/11/2020 -   Chief Complaint  Patient presents with   Follow-up    Pt states he has been okay since last visit and states he feels like his breathing has gotten worse. Pt is currently in pulmonary rehab which he states he feels is helping.     HPI Steven Pitts 74 y.o. -saw him around Memorial Day 2021.  Since then he is seen the nurse practitioner.  We initiated suspicion of interstitial lung  disease work-up.  His results show that he has probable UIP on the CT chest.  His autoimmune panel is negative.  There is no air-trapping.  Based on this I will conclude that he has idiopathic pulmonary fibrosis.  Nurse practitioners of seen him have commenced pulmonary rehabilitation which she states is helping him.  However he says that he is significantly progressed.  In fact documentation shows that he is now requiring 6 L of nasal cannula at rehabilitation.  This is a significant progression.  His symptom scores as documented by nurse practitioner in the last couple of months are also demonstrating symptom worsening.  He has indicated his goal is to fight this to the extent possible.  He is interested in all therapeutic options that are on the table.  His BMI is 31.  He uses a cane because of Parkinson's.  There are no other new issues  Incidental findings include associated emphysema and 7 mm right middle lobe nodule.  He has had cardiac stress test that is negative for ischemia.  His cardiologist is Dr. Einar Gip.    SYMPTOM SCALE - ILD 02/19/2020  05/07/2020   O2 use ra 3-5L; 6.5L with pulmonary rehab  Shortness of Breath 0 -> 5 scale with 5 being worst (score 6 If unable to do) 0 -> 5 scale with 5 being worst (score 6 If unable to do)  At rest 0 2  Simple tasks - showers, clothes change, eating, shaving 0 3  Household (dishes, doing bed, laundry) 3 4  Shopping 4 5  Walking level at own pace 2 4  Walking up Stairs 5 5  Total (30-36) Dyspnea Score 11 23  How bad is your cough? 2 1  How bad is your fatigue yes 5  How bad is nausea no 0  How bad is vomiting?  no 0  How bad is diarrhea? no 0  How bad is anxiety? x 3  How bad is depression  x 4    Simple office walk 185 feet x  3 laps goal with forehead probe 02/19/2020   O2 used ra  Number laps completed 1/4 of 1 laps  Comments about pace Slow pace with cane  Resting Pulse Ox/HR 88% and 63/min  Final Pulse Ox/HR 83% and 77/min    Desaturated </= 88% Even at rest 88%  Desaturated <= 3% points yes  Got Tachycardic >/= 90/min no  Symptoms at end of test dyspneic  Miscellaneous comments Corrected wuthg 3 L to complete 1 lap with cane    TEST/EVENTS : June 2021 IMPRESSION: 1. Spectrum of findings compatible with basilar predominant fibrotic interstitial lung disease without frank honeycombing. Findings are categorized as probable UIP per consensus guidelines: Diagnosis of Idiopathic Pulmonary Fibrosis: An Official ATS/ERS/JRS/ALAT Clinical Practice Guideline. [place], Iss 5, [phone], May 26 2017. 2. Dilated main pulmonary artery, suggesting pulmonary arterial hypertension. 3. Right middle lobe 7 mm solid pulmonary nodule. Non-contrast chest CT at 6-12 months is recommended. If the nodule is stable at time of repeat CT, then future CT at 18-24 months (from today's scan) is considered optional for low-risk patients, but is recommended for high-risk patients. This recommendation follows the consensus statement: Guidelines for Management of Incidental Pulmonary Nodules Detected on CT Images: From the Fleischner Society 2017; Radiology 2017; 284:228-243. 4. Nonspecific mild to moderate mediastinal and mild bilateral hilar lymphadenopathy, potentially reactive. Recommend attention on follow-up chest CT with IV contrast in 3-6 months. 5. Moderate emphysema with diffuse bronchial wall thickening, suggesting COPD. 6. Three-vessel coronary atherosclerosis. 7. Aortic Atherosclerosis (ICD10-I70.0) and Emphysema (ICD10-J43.9).   Electronically Signed   By: Ilona Sorrel M.D.   On: 03/06/2020 14:45      PFT Results Latest Ref Rng & Units 02/24/2020  FVC-Pre L 3.03  FVC-Predicted Pre % 74  FVC-Post L 2.94  FVC-Predicted Post % 72  Pre FEV1/FVC % % 76  Post FEV1/FCV % % 78  FEV1-Pre L 2.29  FEV1-Predicted Pre % 77  FEV1-Post L 2.30  DLCO uncorrected ml/min/mmHg 9.03  DLCO UNC% % 37   DLVA Predicted % 53  TLC L 4.82  TLC % Predicted % 72  RV % Predicted % 76    ROS - per HPI  Results for PREM, COYKENDALL (MRN [id number]) as of 05/11/2020 11:37  Ref. Range 03/19/2020 12:04  Anti Nuclear Antibody (ANA) Latest Ref Range: NEGATIVE  POSITIVE (A)  ANA Pattern 1 Unknown Nuclear, Speckled (A)  ANA Titer 1 Latest Units: titer 1:40 (H)  Angiotensin-Converting Enzyme Latest Ref Range: 9 - 67 U/L 26  Cyclic Citrullin Peptide Ab Latest Units: UNITS <16  ds DNA Ab Latest Units: IU/mL 1  Myeloperoxidase Abs Latest Units: AI <1.0  Serine Protease 3 Latest Units: AI <1.0  RA Latex Turbid. Latest Ref Range: <14 IU/mL <14  SSA (Ro) (ENA) Antibody, IgG Latest Ref Range: <1.0 NEG AI <1.0 NEG  SSB (La) (ENA) Antibody, IgG Latest Ref Range: <1.0 NEG AI <1.0 NEG  Scleroderma (Scl-70) (ENA) Antibody, IgG Latest Ref Range: <1.0 NEG AI <1.0 NEG     OV 07/20/2020  Subjective:  Patient ID: Steven Pitts, male , DOB: 1946-03-10 , age 38 y.o. , MRN: [id number] , ADDRESS: [address] [place] [id number] PCP Leanna Battles, MD Patient Care Team: Leanna Battles, MD as PCP - General (Internal Medicine)  This Provider for this visit: Treatment Team:  Attending Provider: Brand Males, MD  Type of visit:  Telephone/Video Circumstance: COVID-19 national emergency Identification of patient Steven Pitts with [birth year] and MRN [id number] - 2 person identifier Risks: Risks, benefits, limitations of telephone visit explained. Patient understood and verbalized agreement to proceed Anyone else on call: just patient Patient location: home This provider location: at priovider's home due to extenuating circumstance    HPI Steven Pitts 74 y.o. -     07/20/2020 -  No chief complaint on file.  Dx IPF - given 05/11/20.   -- last CT June 2021  - Last PFT June 2021     -  Esbriet start sept 2021 Associated emphysema + Chronic Hypoxemic resp failure due to  aove Parkinson Left Carotid Artery stenosns - Dr Einar Gip Low risk nuclear cardiac stress test - April 2021 DM. Hyperplipidiema  HPI Steven Pitts 74 y.o. - LFT Aug 2021 is normal -> esbriet started since last visit . sTarted 6 weeks ago. Now, taking 3 pills tid. => just tired with it and losing appetite. Lost 8# weight. No NVD. In terms of IPF: feels dyspnea is "some better". Doing rehab. Using 8L with exertion. Got lette from [facility] transplant - > does NOT qualify. His main question is what do about fatigue -> explained to try powering through. Not had Right heart cath      SYMPTOM SCALE - ILD 02/19/2020  05/07/2020   O2 use ra 3-5L; 6.5L with pulmonary rehab  Shortness of Breath 0 -> 5 scale with 5 being worst (score 6 If unable to do) 0 -> 5 scale with 5 being worst (score 6 If unable to do)  At rest 0 2  Simple tasks - showers, clothes change, eating, shaving 0 3  Household (dishes, doing bed, laundry) 3 4  Shopping 4 5  Walking level at own pace 2 4  Walking up Stairs 5 5  Total (30-36) Dyspnea Score 11 23  How bad is your cough? 2 1  How bad is your fatigue yes 5  How bad is nausea no 0  How bad is vomiting?  no 0  How bad is diarrhea? no 0  How bad is anxiety? x 3  How bad is depression x 4    Simple office walk 185 feet x  3 laps goal with forehead probe 02/19/2020   O2 used ra  Number laps completed 1/4 of 1 laps  Comments about pace Slow pace with cane  Resting Pulse Ox/HR 88% and 63/min  Final Pulse Ox/HR 83% and 77/min  Desaturated </= 88% Even at rest 88%  Desaturated <= 3% points yes  Got Tachycardic >/= 90/min no  Symptoms at end of test dyspneic  Miscellaneous comments Corrected wuthg 3 L to complete 1 lap with cane     PFT Results Latest Ref Rng & Units 02/24/2020  FVC-Pre L 3.03  FVC-Predicted Pre % 74  FVC-Post L 2.94  FVC-Predicted Post % 72  Pre FEV1/FVC % % 76  Post FEV1/FCV % % 78  FEV1-Pre L 2.29  FEV1-Predicted Pre % 77  FEV1-Post L  2.30  DLCO uncorrected ml/min/mmHg 9.03  DLCO UNC% % 37  DLVA Predicted % 53  TLC L 4.82  TLC % Predicted % 72  RV % Predicted % 76     ROS - per HPI     has a past medical history of Abnormality of gait, Anxiety, Diabetes ([place]), Essential and other specified forms of tremor, High blood pressure, Other speech disturbance(784.59), and Parkinson's disease ([place]).   reports that  he quit smoking about 31 years ago. His smoking use included cigarettes. He has never used smokeless tobacco.  Past Surgical History:  Procedure Laterality Date   cataract surgery Right    HERNIA REPAIR     NECK SURGERY     x4   ROTATOR CUFF REPAIR Left     Allergies  Allergen Reactions   Gadolinium Derivatives Other (See Comments)    Patient sneezed immediately after administering gadolinium/ no treatment/ pt had no other reaction//jv    Immunization History  Administered Date(s) Administered   PFIZER SARS-COV-2 Vaccination 11/03/2019, 11/26/2019    Family History  Problem Relation Age of Onset   Aneurysm Mother    Alcohol abuse Father    Parkinson's disease Brother      Current Outpatient Medications:    ADVAIR DISKUS 250-50 MCG/DOSE AEPB, Inhale 1 puff into the lungs 2 (two) times daily., Disp: , Rfl:    albuterol (VENTOLIN HFA) 108 (90 Base) MCG/ACT inhaler, INHALE 2 PUFFS EVERY 4-6 HOURS AS NEEDED FOR COUGH/SHORTNESS OF BREATH, Disp: , Rfl:    Armodafinil 150 MG tablet, Take 150 mg by mouth every morning., Disp: , Rfl:    aspirin EC 81 MG tablet, Take 1 tablet (81 mg total) by mouth daily., Disp: 90 tablet, Rfl: 3   atorvastatin (LIPITOR) 20 MG tablet, Take 20 mg by mouth daily., Disp: , Rfl:    augmented betamethasone dipropionate (DIPROLENE-AF) 0.05 % ointment, Apply topically 2 (two) times daily., Disp: , Rfl:    carbidopa-levodopa (SINEMET IR) 25-100 MG tablet, TAKE 1 TABLET BY MOUTH 5 TIMES PER DAY, Disp: 150 tablet, Rfl: 5   DULoxetine (CYMBALTA) 60 MG capsule,  Take 60 mg by mouth daily., Disp: , Rfl:    empagliflozin (JARDIANCE) 25 MG TABS tablet, Take 25 mg by mouth daily., Disp: , Rfl:    furosemide (LASIX) 20 MG tablet, TAKE 1 TABLET BY MOUTH DAILY, Disp: 90 tablet, Rfl: 1   gabapentin (NEURONTIN) 300 MG capsule, Take 300 mg by mouth 2 (two) times daily. , Disp: , Rfl:    HYDROcodone-acetaminophen (NORCO) 10-325 MG per tablet, Take 10-325 tablets by mouth 3 (three) times daily as needed. , Disp: , Rfl:    insulin lispro (HUMALOG) 100 UNIT/ML KwikPen, Inject 8 Units into the skin 3 (three) times daily. , Disp: , Rfl:    metFORMIN (GLUCOPHAGE) 1000 MG tablet, Take 1,000 mg by mouth 2 (two) times daily. , Disp: , Rfl:    Multiple Vitamin (MULTIVITAMIN WITH MINERALS) TABS tablet, Take 1 tablet by mouth daily., Disp: , Rfl:    NEXIUM 40 MG capsule, Take 40 mg by mouth daily., Disp: , Rfl:    Pirfenidone (ESBRIET) 267 MG CAPS, Take 3 capsules by mouth with breakfast, with lunch, and with evening meal., Disp: 270 capsule, Rfl: 0   propranolol (INDERAL) 40 MG tablet, Take 40 mg by mouth 2 (two) times daily. , Disp: , Rfl:    Ropinirole HCl 12 MG TB24, TAKE ONE (1) TABLET BY MOUTH EACH DAY. Please call [phone] to schedule a follow up appt., Disp: 90 tablet, Rfl: 0   telmisartan-hydrochlorothiazide (MICARDIS HCT) 80-25 MG per tablet, Take 1 tablet by mouth daily. , Disp: , Rfl:    Tiotropium Bromide Monohydrate (SPIRIVA RESPIMAT) 2.5 MCG/ACT AERS, Inhale 2 puffs into the lungs daily., Disp: 4 g, Rfl: 5   TOUJEO SOLOSTAR 300 UNIT/ML Solostar Pen, Inject 30 Units into the skin daily. , Disp: , Rfl:       Objective:  There were no vitals filed for this visit.  Estimated body mass index is 31.17 kg/m as calculated from the following:   Height as of 07/19/20: _0  (1.727 m).   Weight as of 07/19/20: 205 lb (93 kg).  _1 @  There were no vitals filed for this visit.   Physical Exam Sounded normal on phone     Assessment:        ICD-10-CM   1. IPF (idiopathic pulmonary fibrosis) ([place])  J84.112   2. Pulmonary emphysema, unspecified emphysema type ([place])  J43.9   3. Chronic respiratory failure with hypoxia (HCC)  J96.11   4. Enlarged pulmonary artery (HCC)  I28.8   5. Nodule of middle lobe of right lung  R91.1   6. Drug-induced weight loss  R63.4    T50.905A   7. Other fatigue  R53.83   8. Encounter for medication management  Z79.899        Plan:     Patient Instructions     ICD-10-CM   1. IPF (idiopathic pulmonary fibrosis) ([place])  J84.112   2. Pulmonary emphysema, unspecified emphysema type ([place])  J43.9   3. Chronic respiratory failure with hypoxia (HCC)  J96.11   4. Enlarged pulmonary artery (HCC)  I28.8   5. Nodule of middle lobe of right lung  R91.1   6. Drug-induced weight loss  R63.4    T50.905A   7. Other fatigue  R53.83   8. Encounter for medication management  Z79.899     Chronic respiratory failure with hypoxia (HCC) IPF (idiopathic pulmonary fibrosis) ([place])  -possibly stable since last visit  And glad rehab is helping but need to restaage - too bad [facility] said you do not qualify for lung transplant  Plan  - continue esbriet per protocol - did you get rid of feather pillow? - continue 24/7 o2 with 8L with exertion  - continue rehab - do spirometry/dlco in  Dec 2021  Fatigue Weight loss Therapeutic drug monitoring   - all likely due to [place]  - check LFT this week anytime - for now continue esbriet; at followup depending on level of fatigue/weight loss might have to decide the future fate of esbriet   Pulmonary emphysema, unspecified emphysema type ([place]) -dx given aug 2021  -You have associated emphysema with prior smoking  Plan - Continue spiriva RESPIMAT 2 puff daily  - continue advair  Enlarged pulmonary artery ([place]) - SEEN ON ct   You might have pulmonary hypertension that  is contributing to excess oxygen need  Plan -I have sent a message to Dr. Einar Gip and  see if he should do a right heart catheterization on you  Nodule of middle lobe of right lung -7 mm June 2021 -low risk for lung cancer  Plan -Repeat high-resolution CT chest December 2021  Follow-up -Return to see me in Dec 2021 ON A 30-minute slot on any day -for face-to-face visit but after HRCT and Spiro/DLCO  -  - ILD symptom score at followup      (Telephone visit - Level 03 visit: Estb 21-30 for this visit type which was telphone in total care time and counseling or/and coordination of care by this undersigned MD - Dr Brand Males. This includes one or more of the following for care delivered on 07/20/2020 same day: pre-charting, chart review, note writing, documentation discussion of test results, diagnostic or treatment recommendations, prognosis, risks and benefits of management options, instructions, education, compliance or risk-factor reduction. It excludes time spent  by the [place] or office staff in the care of the patient. Actual time was 25 min. E&M code is [phone])     SIGNATURE    Dr. Brand Males, M.D., F.C.C.P,  Pulmonary and Critical Care Medicine Staff Physician, [place] Director - Interstitial Lung Disease  Program  Pulmonary [place] [facility] [place], [place], [address]  Pager: [phone], If no answer or between  15:00h - 7:00h: call [phone] Telephone: [phone]  9:17 AM 07/20/2020

## 2020-07-20 NOTE — Patient Instructions (Addendum)
ICD-10-CM   1. IPF (idiopathic pulmonary fibrosis) ([place])  J84.112   2. Pulmonary emphysema, unspecified emphysema type ([place])  J43.9   3. Chronic respiratory failure with hypoxia ([facility])  J96.11   4. Enlarged pulmonary artery ([facility])  I28.8   5. Nodule of middle lobe of right lung  R91.1   6. Drug-induced weight loss  R63.4    T50.905A   7. Other fatigue  R53.83   8. Encounter for medication management  Z79.899     Chronic respiratory failure with hypoxia ([place]) IPF (idiopathic pulmonary fibrosis) ([place])  -possibly stable since last visit  And glad rehab is helping but need to restaage - too bad [facility] said you do not qualify for lung transplant  Plan  - continue esbriet per protocol - did you get rid of feather pillow? - continue 24/7 o2 with 8L with exertion  - continue rehab - do spirometry/dlco in  Dec 2021  Fatigue Weight loss Therapeutic drug monitoring   - all likely due to [place]  - check LFT this week anytime - for now continue esbriet; at followup depending on level of fatigue/weight loss might have to decide the future fate of esbriet   Pulmonary emphysema, unspecified emphysema type ([place]) -dx given aug 2021  -You have associated emphysema with prior smoking  Plan - Continue spiriva RESPIMAT 2 puff daily  - continue advair  Enlarged pulmonary artery ([place]) - SEEN ON ct   You might have pulmonary hypertension that  is contributing to excess oxygen need  Plan -I have sent a message to Dr. Einar Gip and see if he should do a right heart catheterization on you  Nodule of middle lobe of right lung -7 mm June 2021 -low risk for lung cancer  Plan -Repeat high-resolution CT chest December 2021  Follow-up -Return to see me in Dec 2021 ON A 30-minute slot on any day -for face-to-face visit but after HRCT and Spiro/DLCO  -  - ILD symptom score at followup

## 2020-07-20 NOTE — Telephone Encounter (Signed)
Sure, will reach out to him

## 2020-07-20 NOTE — Telephone Encounter (Signed)
Steven Pitts  Would you be kind enough to eval Steven Pitts  for right heart cath?  Thanks   SIGNATURE    Dr. Brand Males, M.D., F.C.C.P,  Pulmonary and Critical Care Medicine Staff Physician, [facility] Director - Interstitial Lung Disease  Program  [facility] [place], [place], [address]  Pager: [phone], If no answer  OR between  19:00-7:00h: page [phone] Telephone (clinical office): [phone] Telephone (research): [phone]  9:14 AM 07/20/2020

## 2020-07-20 NOTE — Telephone Encounter (Signed)
Thanks Ulice Dash . Will clos emessage. Reply not needed

## 2020-07-22 DIAGNOSIS — Z77098 Contact with and (suspected) exposure to other hazardous, chiefly nonmedicinal, chemicals: Secondary | ICD-10-CM | POA: Diagnosis not present

## 2020-07-22 DIAGNOSIS — R0902 Hypoxemia: Secondary | ICD-10-CM | POA: Diagnosis not present

## 2020-07-22 DIAGNOSIS — R0600 Dyspnea, unspecified: Secondary | ICD-10-CM | POA: Diagnosis not present

## 2020-07-22 DIAGNOSIS — I5032 Chronic diastolic (congestive) heart failure: Secondary | ICD-10-CM | POA: Diagnosis not present

## 2020-07-22 DIAGNOSIS — R9389 Abnormal findings on diagnostic imaging of other specified body structures: Secondary | ICD-10-CM | POA: Diagnosis not present

## 2020-07-23 ENCOUNTER — Other Ambulatory Visit: Payer: Self-pay | Admitting: Internal Medicine

## 2020-07-23 ENCOUNTER — Other Ambulatory Visit: Payer: Self-pay | Admitting: Pharmacist

## 2020-07-23 DIAGNOSIS — J84112 Idiopathic pulmonary fibrosis: Secondary | ICD-10-CM

## 2020-07-23 MED FILL — ESBRIET 267 MG CAPSULE: 267 | 30 days supply | Qty: 270 | Fill #0

## 2020-07-23 NOTE — Telephone Encounter (Signed)
Routing refill request

## 2020-08-05 DIAGNOSIS — F3341 Major depressive disorder, recurrent, in partial remission: Secondary | ICD-10-CM | POA: Diagnosis not present

## 2020-08-12 DIAGNOSIS — F3341 Major depressive disorder, recurrent, in partial remission: Secondary | ICD-10-CM | POA: Diagnosis not present

## 2020-08-17 DIAGNOSIS — Z794 Long term (current) use of insulin: Secondary | ICD-10-CM | POA: Diagnosis not present

## 2020-08-17 DIAGNOSIS — E1140 Type 2 diabetes mellitus with diabetic neuropathy, unspecified: Secondary | ICD-10-CM | POA: Diagnosis not present

## 2020-08-17 DIAGNOSIS — I130 Hypertensive heart and chronic kidney disease with heart failure and stage 1 through stage 4 chronic kidney disease, or unspecified chronic kidney disease: Secondary | ICD-10-CM | POA: Diagnosis not present

## 2020-08-22 DIAGNOSIS — R4020 Unspecified coma: Secondary | ICD-10-CM | POA: Diagnosis not present

## 2020-08-22 DIAGNOSIS — R9389 Abnormal findings on diagnostic imaging of other specified body structures: Secondary | ICD-10-CM | POA: Diagnosis not present

## 2020-08-22 DIAGNOSIS — I5032 Chronic diastolic (congestive) heart failure: Secondary | ICD-10-CM | POA: Diagnosis not present

## 2020-08-22 DIAGNOSIS — R0689 Other abnormalities of breathing: Secondary | ICD-10-CM | POA: Diagnosis not present

## 2020-08-22 DIAGNOSIS — R404 Transient alteration of awareness: Secondary | ICD-10-CM | POA: Diagnosis not present

## 2020-08-22 DIAGNOSIS — Z77098 Contact with and (suspected) exposure to other hazardous, chiefly nonmedicinal, chemicals: Secondary | ICD-10-CM | POA: Diagnosis not present

## 2020-08-22 DIAGNOSIS — R0600 Dyspnea, unspecified: Secondary | ICD-10-CM | POA: Diagnosis not present

## 2020-08-22 DIAGNOSIS — R0902 Hypoxemia: Secondary | ICD-10-CM | POA: Diagnosis not present

## 2020-08-22 DIAGNOSIS — I469 Cardiac arrest, cause unspecified: Secondary | ICD-10-CM | POA: Diagnosis not present

## 2020-08-25 DIAGNOSIS — 419620001 Death: Secondary | SNOMED CT | POA: Diagnosis not present

## 2020-08-25 DEATH — deceased

## 2020-08-26 ENCOUNTER — Ambulatory Visit (HOSPITAL_BASED_OUTPATIENT_CLINIC_OR_DEPARTMENT_OTHER): Payer: Medicare PPO

## 2020-10-12 ENCOUNTER — Ambulatory Visit: Payer: Medicare PPO | Admitting: Internal Medicine

## 2020-12-21 ENCOUNTER — Other Ambulatory Visit (HOSPITAL_COMMUNITY): Payer: Self-pay

## 2021-01-17 ENCOUNTER — Ambulatory Visit: Payer: TRICARE For Life (TFL) | Admitting: Cardiology

## 2021-01-28 ENCOUNTER — Other Ambulatory Visit (HOSPITAL_COMMUNITY): Payer: Self-pay

## 2021-05-09 ENCOUNTER — Other Ambulatory Visit (HOSPITAL_COMMUNITY): Payer: Self-pay

## 2022-04-08 IMAGING — CT CT CHEST HIGH RESOLUTION W/O CM
2 of 6 series · 13 of 36 positions shown, 16 images · non-contrast
Comparison: 12/22/2019 chest radiograph.

CLINICAL DATA: Chronic dyspnea for 4 months. Former smoker per,
quit 30 years prior.

EXAM:
CT CHEST WITHOUT CONTRAST
TECHNIQUE: Multidetector CT imaging of the chest was performed following the
standard protocol without intravenous contrast. High resolution
imaging of the lungs, as well as inspiratory and expiratory imaging,
was performed.

[Series 4: thorax · axial · 0.98mm/px · z∈[-336,-86]mm · 10 of 149 slices shown, 13 images]
[im 12/149  mediastinal]
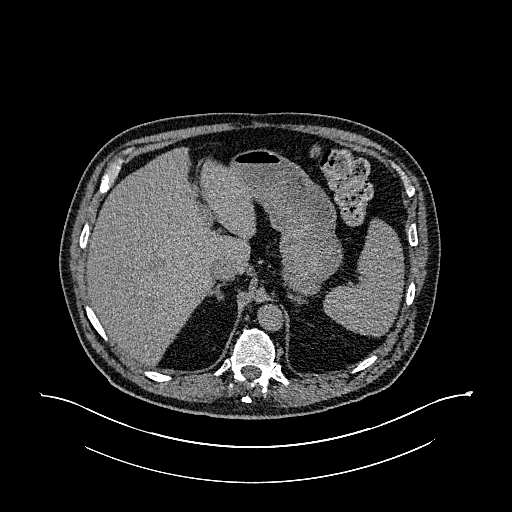
[im 12/149  lung]
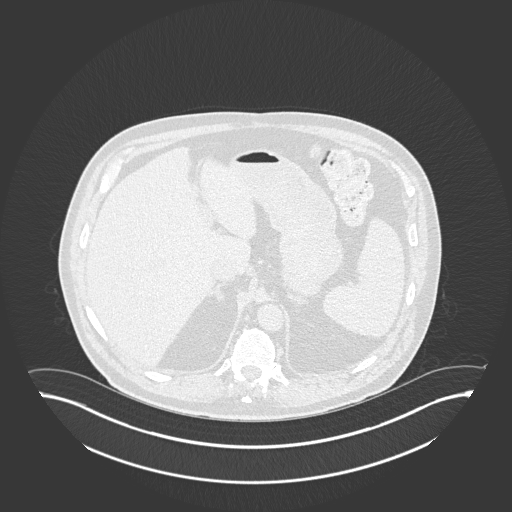
[im 23/149  lung]
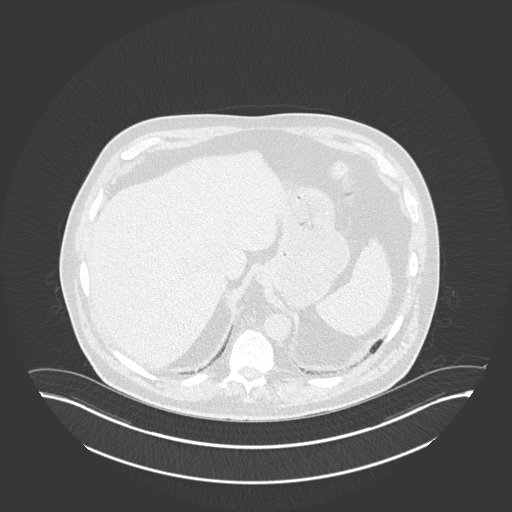
[im 46/149  lung]
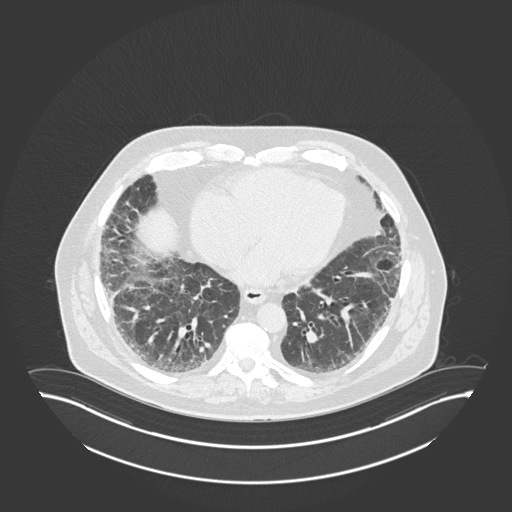
[im 57/149  lung]
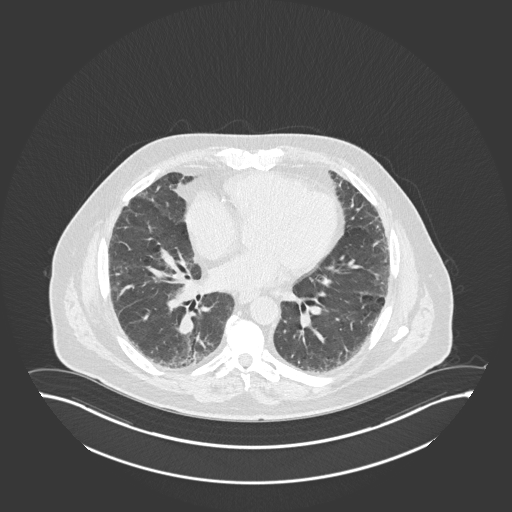
[im 69/149  mediastinal]
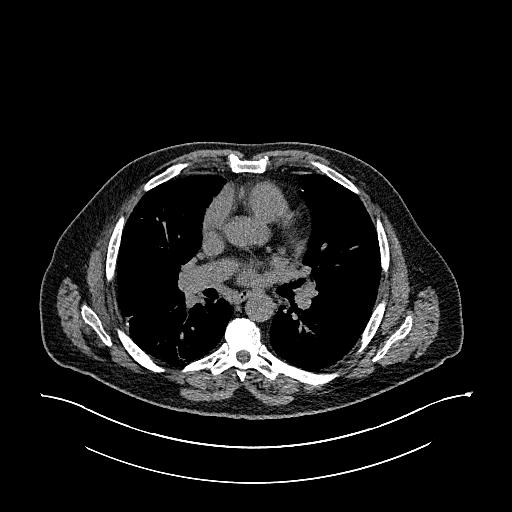
[im 69/149  lung]
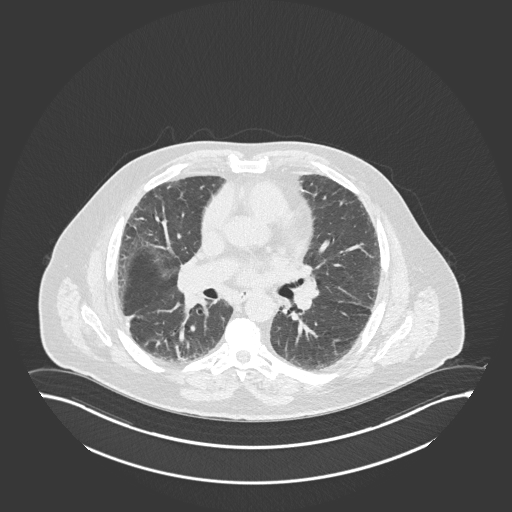
[im 80/149  lung]
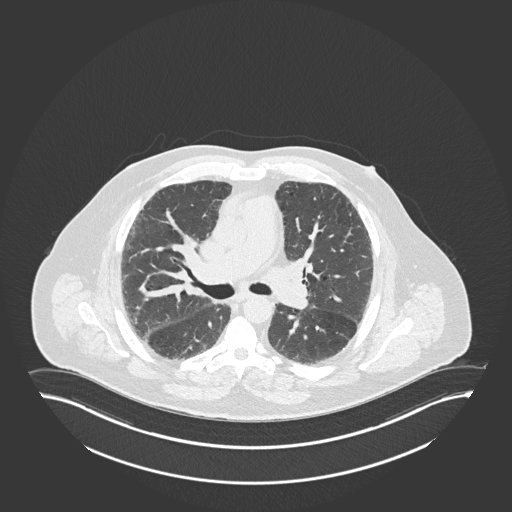
[im 92/149  lung]
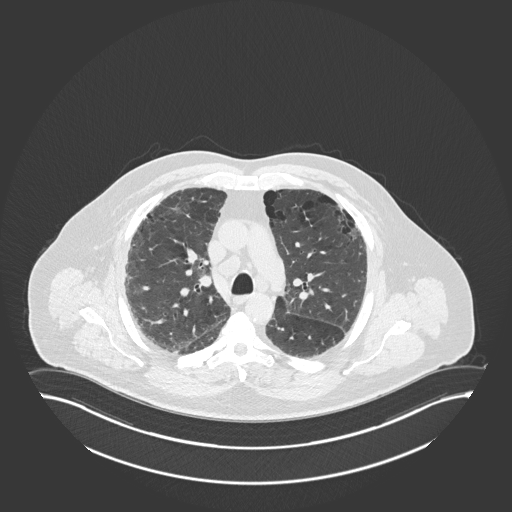
[im 114/149  lung]
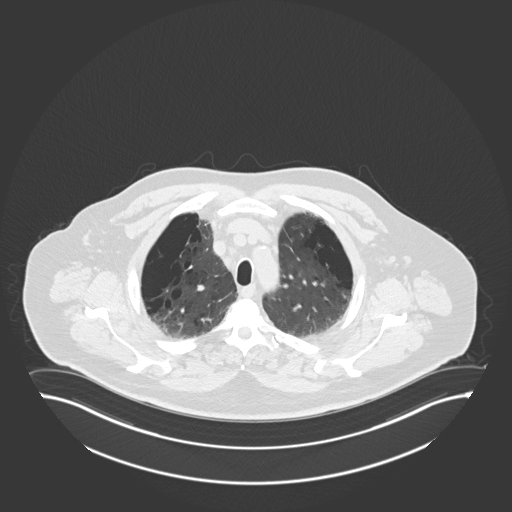
[im 126/149  mediastinal]
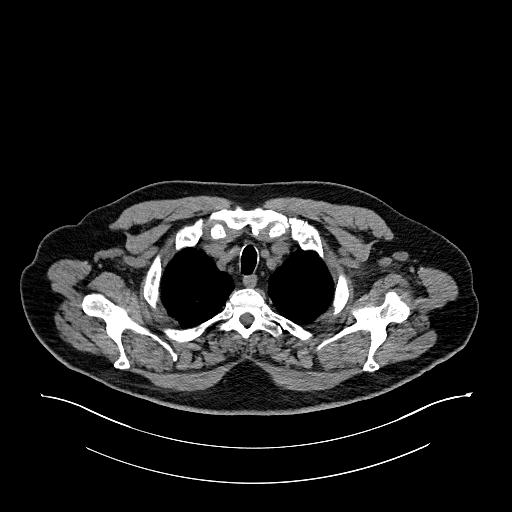
[im 126/149  lung]
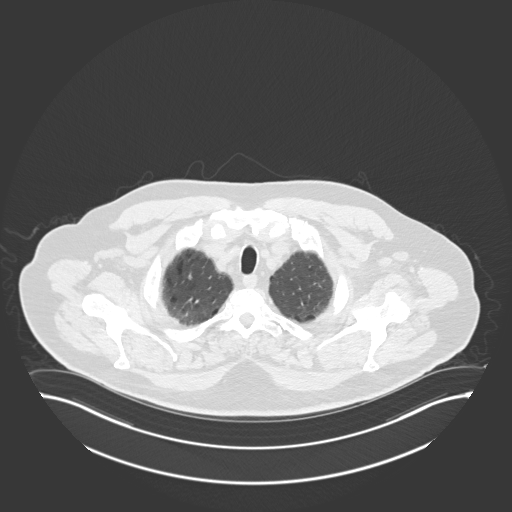
[im 137/149  lung]
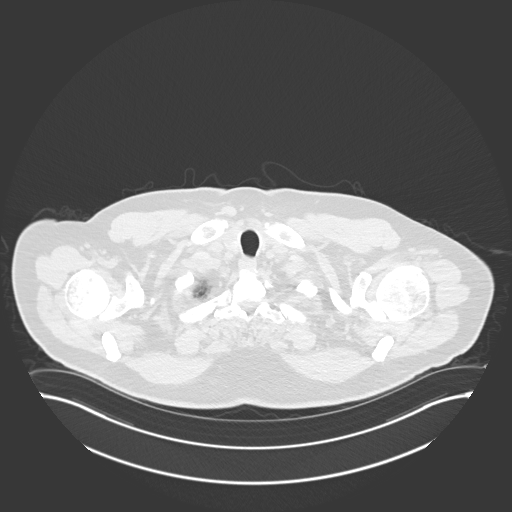

[Series 8: coronal · coronal · 0.66mm/px · 3 of 151 slices shown]
[im 31/151  lung]
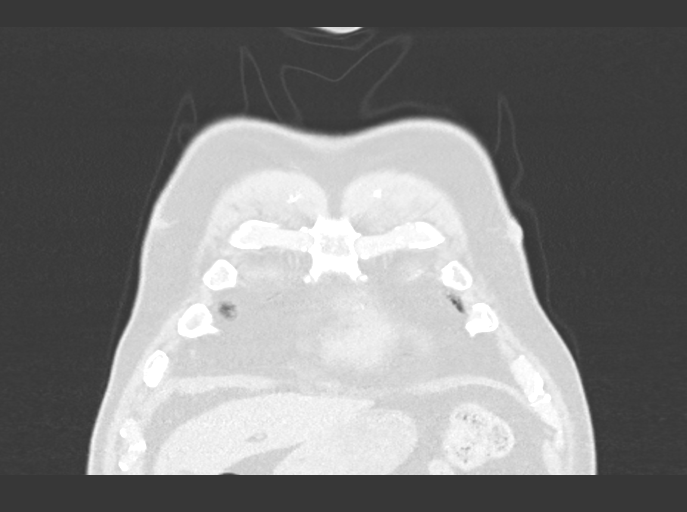
[im 61/151  lung]
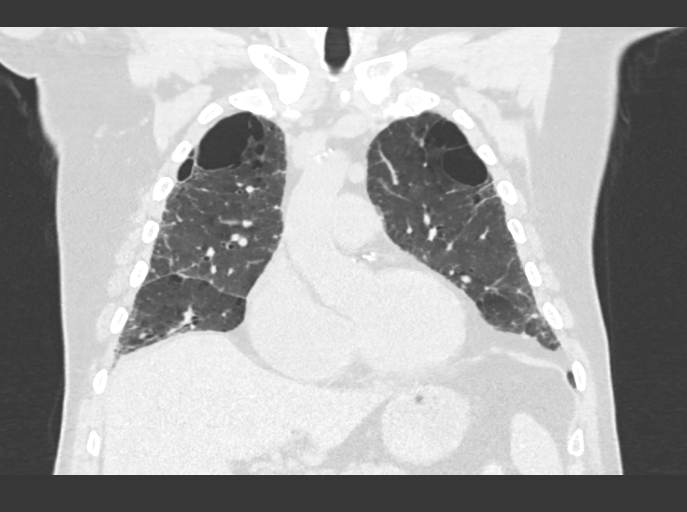
[im 91/151  lung]
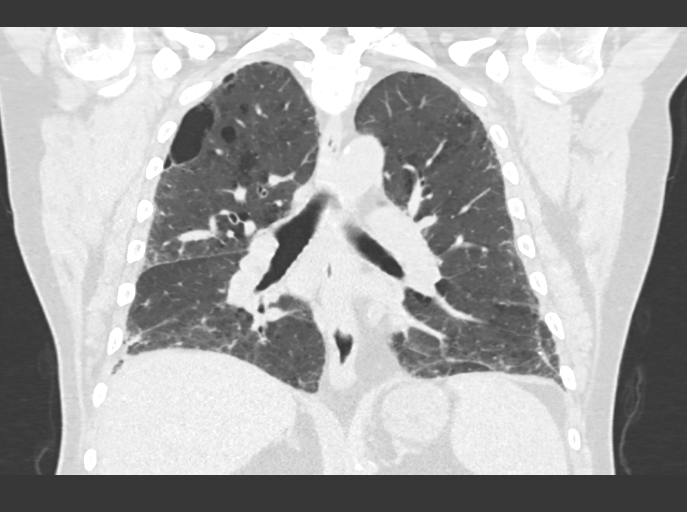

[13 of 36 positions shown; findings below may reference images not displayed]

FINDINGS: Cardiovascular: Mild cardiomegaly. No significant pericardial
effusion/thickening. Three-vessel coronary atherosclerosis.
Atherosclerotic nonaneurysmal thoracic aorta. Dilated main pulmonary
artery (3.5 cm diameter).

Mediastinum/Nodes: Hypodense 1.4 cm right thyroid nodule. Not
clinically significant; no follow-up imaging recommended (ref: [HOSPITAL]. [DATE]): 143-50). Unremarkable esophagus. No
axillary adenopathy. Enlarged right paratracheal nodes up to 1.5 cm
(series 4/image 57). Enlarged 1.3 cm right subcarinal node (series
4/image 80). Enlarged 1.6 cm AP window node (series 4/image 53).
Suggestion of mild bilateral hilar adenopathy, poorly delineated on
these noncontrast images.

Lungs/Pleura: No pneumothorax. No pleural effusion. Moderate
centrilobular and paraseptal emphysema with diffuse bronchial wall
thickening. No acute consolidative airspace disease or lung masses.
Peripheral right middle lobe 7 mm solid pulmonary nodule (series
6/image 105). No additional significant pulmonary nodules. No
significant lobular air trapping or evidence of
tracheobronchomalacia on the expiration sequence. There is moderate
patchy confluent subpleural reticulation and ground-glass opacity in
both lungs with a clear basilar predominance and with associated
mild traction bronchiectasis and architectural distortion. No frank
honeycombing.

Upper abdomen: Simple 1.3 cm upper right renal cyst.

Musculoskeletal: No aggressive appearing focal osseous lesions.
Partially visualized surgical hardware from [facility]CD[facility]. Moderate thoracic
spondylosis.
IMPRESSION: 1. Spectrum of findings compatible with basilar predominant fibrotic
interstitial lung disease without frank honeycombing. Findings are
categorized as probable UIP per consensus guidelines: Diagnosis of
Idiopathic Pulmonary Fibrosis: An Official ATS/ERS/JRS/ALAT Clinical
Practice Guideline. Am J Respir Crit Care Med Vol 198, Lindy 5,
ppe88-e[DATE].
2. Dilated main pulmonary artery, suggesting pulmonary arterial
hypertension.
3. Right middle lobe 7 mm solid pulmonary nodule. Non-contrast chest
CT at 6-12 months is recommended. If the nodule is stable at time of
repeat CT, then future CT at 18-24 months (from today's scan) is
considered optional for low-risk patients, but is recommended for
high-risk patients. This recommendation follows the consensus
statement: Guidelines for Management of Incidental Pulmonary Nodules
Detected on CT Images: From the [HOSPITAL] 9180; Radiology
9180; [DATE].
4. Nonspecific mild to moderate mediastinal and mild bilateral hilar
lymphadenopathy, potentially reactive. Recommend attention on
follow-up chest CT with IV contrast in 3-6 months.
5. Moderate emphysema with diffuse bronchial wall thickening,
suggesting COPD.
6. Three-vessel coronary atherosclerosis.
7. Aortic Atherosclerosis ([id number]) and Emphysema ([id number]).
